# Patient Record
Sex: Female | Born: 1970 | Race: White | Hispanic: No | Marital: Single | Smoking: Current every day smoker
Health system: Southern US, Community
[De-identification: ages and names within clinical notes are randomized; demographics above are authoritative.]

## PROBLEM LIST (undated history)

## (undated) DIAGNOSIS — M5430 Sciatica, unspecified side: Secondary | ICD-10-CM

## (undated) DIAGNOSIS — I10 Essential (primary) hypertension: Secondary | ICD-10-CM

## (undated) DIAGNOSIS — M5136 Other intervertebral disc degeneration, lumbar region: Secondary | ICD-10-CM

## (undated) HISTORY — DX: Sciatica, unspecified side: M54.30

## (undated) HISTORY — DX: Other intervertebral disc degeneration, lumbar region: M51.36

## (undated) HISTORY — PX: TONSILLECTOMY: SUR1361

## (undated) HISTORY — PX: TUBAL LIGATION: SHX77

---

## 2004-09-11 ENCOUNTER — Ambulatory Visit (HOSPITAL_COMMUNITY): Admission: AD | Admit: 2004-09-11 | Discharge: 2004-09-11 | Payer: Self-pay | Admitting: Obstetrics and Gynecology

## 2004-09-11 ENCOUNTER — Encounter: Payer: Self-pay | Admitting: Emergency Medicine

## 2005-05-02 ENCOUNTER — Emergency Department (HOSPITAL_COMMUNITY): Admission: EM | Admit: 2005-05-02 | Discharge: 2005-05-02 | Payer: Self-pay | Admitting: Emergency Medicine

## 2009-05-11 ENCOUNTER — Emergency Department: Payer: Self-pay

## 2013-11-02 ENCOUNTER — Emergency Department: Payer: Self-pay | Admitting: Internal Medicine

## 2013-11-02 LAB — URINALYSIS, COMPLETE
Bilirubin,UR: NEGATIVE
Glucose,UR: NEGATIVE mg/dL (ref 0–75)
Ketone: NEGATIVE
Nitrite: POSITIVE
Ph: 6 (ref 4.5–8.0)
Protein: 100
RBC,UR: 70 /HPF (ref 0–5)
Specific Gravity: 1.011 (ref 1.003–1.030)
Squamous Epithelial: NONE SEEN
WBC UR: 5186 /HPF (ref 0–5)

## 2016-07-31 ENCOUNTER — Encounter: Payer: Self-pay | Admitting: Emergency Medicine

## 2016-07-31 ENCOUNTER — Emergency Department
Admission: EM | Admit: 2016-07-31 | Discharge: 2016-07-31 | Disposition: A | Discharge status: Home / Self Care | Payer: Self-pay | Attending: Emergency Medicine | Admitting: Emergency Medicine

## 2016-07-31 DIAGNOSIS — Z760 Encounter for issue of repeat prescription: Secondary | ICD-10-CM | POA: Insufficient documentation

## 2016-07-31 DIAGNOSIS — F17210 Nicotine dependence, cigarettes, uncomplicated: Secondary | ICD-10-CM | POA: Insufficient documentation

## 2016-07-31 DIAGNOSIS — I10 Essential (primary) hypertension: Secondary | ICD-10-CM | POA: Insufficient documentation

## 2016-07-31 HISTORY — DX: Essential (primary) hypertension: I10

## 2016-07-31 LAB — CBC
HCT: 45.6 % (ref 35.0–47.0)
Hemoglobin: 15.5 g/dL (ref 12.0–16.0)
MCH: 31.2 pg (ref 26.0–34.0)
MCHC: 34.1 g/dL (ref 32.0–36.0)
MCV: 91.5 fL (ref 80.0–100.0)
PLATELETS: 170 10*3/uL (ref 150–440)
RBC: 4.98 MIL/uL (ref 3.80–5.20)
RDW: 13.7 % (ref 11.5–14.5)
WBC: 7.5 10*3/uL (ref 3.6–11.0)

## 2016-07-31 LAB — BASIC METABOLIC PANEL
Anion gap: 4 — ABNORMAL LOW (ref 5–15)
BUN: 16 mg/dL (ref 6–20)
CALCIUM: 8.6 mg/dL — AB (ref 8.9–10.3)
CHLORIDE: 107 mmol/L (ref 101–111)
CO2: 28 mmol/L (ref 22–32)
CREATININE: 0.76 mg/dL (ref 0.44–1.00)
Glucose, Bld: 130 mg/dL — ABNORMAL HIGH (ref 65–99)
Potassium: 4 mmol/L (ref 3.5–5.1)
SODIUM: 139 mmol/L (ref 135–145)

## 2016-07-31 MED ORDER — HYDROCHLOROTHIAZIDE 25 MG PO TABS: 25.0000 mg | ORAL_TABLET | Freq: Every day | ORAL | 1 refills | Status: DC

## 2016-07-31 MED ORDER — LISINOPRIL 20 MG PO TABS: 20.0000 mg | ORAL_TABLET | Freq: Every day | ORAL | 1 refills | Status: DC

## 2016-07-31 MED ORDER — HYDROCHLOROTHIAZIDE 25 MG PO TABS
25.0000 mg | ORAL_TABLET | Freq: Every day | ORAL | Status: DC
  Administered 2016-07-31: 25 mg via ORAL
  Filled 2016-07-31 (×2): qty 1

## 2016-07-31 MED ORDER — LISINOPRIL 10 MG PO TABS
20.0000 mg | ORAL_TABLET | Freq: Once | ORAL | Status: AC
  Administered 2016-07-31: 20 mg via ORAL
  Filled 2016-07-31: qty 2

## 2016-07-31 NOTE — ED Notes (Signed)
Pt in via triage with complaints of high blood pressure.  Pt reports being on Lisinopril and Hctz, [facility] since recently getting out of prison to have prescriptions refilled.  Pt reports being off lisinopril x approximately 1 week.  Pt BP 163/127 currently.  Pt denies any headache, does report tension to back of neck.  Pt A/Ox4, no immediate distress noted.

## 2016-07-31 NOTE — ED Provider Notes (Signed)
[facility] Emergency Department Provider Note        Time seen: ----------------------------------------- 2:26 PM on 07/31/2016 -----------------------------------------    I have reviewed the triage vital signs and the nursing notes.   HISTORY  Chief Complaint Hypertension and Migraine    HPI Latoya Brown is a 45 y.o. female who presents to ER with dizziness and headache for the last several days. Patient states her blood pressure was taken a few days ago and it was high. Today she feels as if she has a tension type headache and upper back muscle tightness. She doesn't history of hypertension but has been intermittently taking medications due to financial reasons since July. She is to be on lisinopril 40 mg daily and HCTZ 25 mg daily. She does not currently have a doctor.   Past Medical History:  Diagnosis Date  . Hypertension     There are no active problems to display for this patient.   History reviewed. No pertinent surgical history.  Allergies Aspirin and Penicillins  Social History Social History  Substance Use Topics  . Smoking status: Current Every Day Smoker    Packs/day: 1.00    Types: Cigarettes  . Smokeless tobacco: Never Used  . Alcohol use No   Review of Systems Constitutional: Negative for fever. Cardiovascular: Negative for chest pain. Respiratory: Negative for shortness of breath. Gastrointestinal: Negative for abdominal pain, vomiting and diarrhea. Genitourinary: Negative for dysuria. Musculoskeletal: Positive for back pain Skin: Negative for rash. Neurological: Negative for headaches, focal weakness or numbness.  10-point ROS otherwise negative.  ____________________________________________   PHYSICAL EXAM:  VITAL SIGNS: ED Triage Vitals  Enc Vitals Group     BP 07/31/16 1205 (!) 147/104     Pulse Rate 07/31/16 1205 92     Resp 07/31/16 1205 18     Temp 07/31/16 1205 98.3 F (36.8 C)     Temp Source  07/31/16 1205 Oral     SpO2 07/31/16 1205 94 %     Weight 07/31/16 1206 250 lb (113.4 kg)     Height 07/31/16 1206 5\' 5"  (1.651 m)     Head Circumference --      Peak Flow --      Pain Score 07/31/16 1206 5     Pain Loc --      Pain Edu? --      Excl. in GC? --    Constitutional: Alert and oriented. Well appearing and in no distress. Eyes: Conjunctivae are normal. PERRL. Normal extraocular movements. ENT   Head: Normocephalic and atraumatic.   Nose: No congestion/rhinnorhea.   Mouth/Throat: Mucous membranes are moist.   Neck: No stridor. Cardiovascular: Normal rate, regular rhythm. No murmurs, rubs, or gallops. Respiratory: Normal respiratory effort without tachypnea nor retractions. Breath sounds are clear and equal bilaterally. No wheezes/rales/rhonchi. Gastrointestinal: Soft and nontender. Normal bowel sounds Musculoskeletal: Nontender with normal range of motion in all extremities. No lower extremity tenderness nor edema. Neurologic:  Normal speech and language. No gross focal neurologic deficits are appreciated.  Skin:  Skin is warm, dry and intact. No rash noted. Psychiatric: Mood and affect are normal. Speech and behavior are normal.  ____________________________________________  EKG: Interpreted by me. Normal sinus rhythm with a rate of 93 bpm, normal. Left ventricular hypertrophy, left axis deviation  ____________________________________________  ED COURSE:  Pertinent labs & imaging results that were available during my care of the patient were reviewed by me and considered in my medical decision making (see chart  for details). Clinical Course  Patients in no acute distress, her previous symptoms of headache today and dizziness have resolved. She is requesting refill of her antihypertensive medicines which we will gladly provide for her.  Procedures ____________________________________________   LABS (pertinent positives/negatives)  Labs Reviewed  BASIC  METABOLIC PANEL - Abnormal; Notable for the following:       Result Value   Glucose, Bld 130 (*)    Calcium 8.6 (*)    Anion gap 4 (*)    All other components within normal limits  CBC  URINALYSIS COMPLETEWITH MICROSCOPIC ([facility] ONLY)  CBG MONITORING, ED  ____________________________________________  FINAL ASSESSMENT AND PLAN  Hypertension, medication refill  Plan: Patient with labs as dictated above. Patient's labs and testing or reassuring here. She does likely have long-standing hypertension but otherwise is asymptomatic at this point. I have restarted lisinopril and HCTZ, [facility].   Emily Filbert, MD   Note: This dictation was prepared with Dragon dictation. Any transcriptional errors that result from this process are unintentional    Emily Filbert, MD 07/31/16 [id number]

## 2016-07-31 NOTE — ED Triage Notes (Addendum)
C/O dizziness and headache for the past few days.  States she had her blood pressure taken a few days ago and it was high.  Today feels as if she has a tension headache and upper back muscle tightness.  Patient states she has history of HTN, but has not taken any medications since July.  Had been given lisinpril 40 mg QD, and HCTZ 5 mg daily.  Patient does not have a PCP.

## 2016-10-27 ENCOUNTER — Encounter: Payer: Self-pay | Admitting: Emergency Medicine

## 2016-10-27 ENCOUNTER — Emergency Department
Admission: EM | Admit: 2016-10-27 | Discharge: 2016-10-27 | Disposition: A | Discharge status: Home / Self Care | Payer: Self-pay | Attending: Emergency Medicine | Admitting: Emergency Medicine

## 2016-10-27 ENCOUNTER — Emergency Department: Payer: Self-pay

## 2016-10-27 DIAGNOSIS — F17210 Nicotine dependence, cigarettes, uncomplicated: Secondary | ICD-10-CM | POA: Insufficient documentation

## 2016-10-27 DIAGNOSIS — J209 Acute bronchitis, unspecified: Secondary | ICD-10-CM | POA: Insufficient documentation

## 2016-10-27 DIAGNOSIS — Z79899 Other long term (current) drug therapy: Secondary | ICD-10-CM | POA: Insufficient documentation

## 2016-10-27 DIAGNOSIS — I10 Essential (primary) hypertension: Secondary | ICD-10-CM | POA: Insufficient documentation

## 2016-10-27 MED ORDER — CLONIDINE HCL 0.1 MG PO TABS
0.2000 mg | ORAL_TABLET | Freq: Once | ORAL | Status: AC
  Administered 2016-10-27: 0.2 mg via ORAL
  Filled 2016-10-27: qty 2

## 2016-10-27 MED ORDER — BENZONATATE 100 MG PO CAPS
200.0000 mg | ORAL_CAPSULE | Freq: Once | ORAL | Status: AC
  Administered 2016-10-27: 200 mg via ORAL
  Filled 2016-10-27: qty 2

## 2016-10-27 MED ORDER — METHYLPREDNISOLONE 4 MG PO TBPK: ORAL_TABLET | ORAL | 0 refills | Status: DC

## 2016-10-27 MED ORDER — HYDROCOD POLST-CPM POLST ER 10-8 MG/5ML PO SUER: 5.0000 mL | Freq: Two times a day (BID) | ORAL | 0 refills | Status: DC

## 2016-10-27 MED ORDER — METHYLPREDNISOLONE SODIUM SUCC 125 MG IJ SOLR
125.0000 mg | Freq: Once | INTRAMUSCULAR | Status: AC
  Administered 2016-10-27: 125 mg via INTRAMUSCULAR
  Filled 2016-10-27: qty 2

## 2016-10-27 MED ORDER — IPRATROPIUM-ALBUTEROL 0.5-2.5 (3) MG/3ML IN SOLN
3.0000 mL | Freq: Once | RESPIRATORY_TRACT | Status: AC
  Administered 2016-10-27: 3 mL via RESPIRATORY_TRACT
  Filled 2016-10-27: qty 3

## 2016-10-27 NOTE — ED Provider Notes (Signed)
[facility] Emergency Department Provider Note   ____________________________________________   First MD Initiated Contact with Patient 10/27/16 1438     (approximate)  I have reviewed the triage vital signs and the nursing notes.   HISTORY  Chief Complaint Cough    HPI Latoya Brown is a 45 y.o. female patient complaining of chest congestion with nonproductive cough for 4 days. Patient has a positive tobacco use half pack per day over 25 years. Patient denies nasal congestion runny nose this complaint. Patient denies sore throat. Patient stated there is no nausea vomiting or diarrhea. Patient states her blood pressure was high because she believes she's not on a creek dose of her medications. Patient states she was taking lisinopril 40 mg but she went off a couple months and restarted her back and 20 mg. Patient states [facility] but did not have an appointment until next week. Patient is still taking hydrochlorothiazide at 25 mg.Patient denies headache, vision disturbance or vertigo with her elevated blood pressure.   Past Medical History:  Diagnosis Date  . Hypertension     There are no active problems to display for this patient.   No past surgical history on file.  Prior to Admission medications   Medication Sig Start Date End Date Taking? Authorizing Provider  chlorpheniramine-HYDROcodone (TUSSIONEX PENNKINETIC ER) 10-8 MG/5ML SUER Take 5 mLs by mouth 2 (two) times daily. 10/27/16   Joni Reiningonald K Amara Justen, PA-C  hydrochlorothiazide (HYDRODIURIL) 25 MG tablet Take 1 tablet (25 mg total) by mouth daily. 07/31/16   Emily FilbertJonathan E Williams, MD  lisinopril (PRINIVIL,ZESTRIL) 20 MG tablet Take 1 tablet (20 mg total) by mouth daily. 07/31/16   Emily FilbertJonathan E Williams, MD  methylPREDNISolone (MEDROL DOSEPAK) 4 MG TBPK tablet Take Tapered dose as directed 10/27/16   Joni Reiningonald K Proctor Carriker, PA-C    Allergies Aspirin and Penicillins  No  family history on file.  Social History Social History  Substance Use Topics  . Smoking status: Current Every Day Smoker    Packs/day: 1.00    Types: Cigarettes  . Smokeless tobacco: Never Used  . Alcohol use No    Review of Systems Constitutional: No fever/chills Eyes: No visual changes. ENT: No sore throat. Cardiovascular: Denies chest pain. Respiratory: Denies shortness of breath. Nonproductive cough and wheezing. Gastrointestinal: No abdominal pain.  No nausea, no vomiting.  No diarrhea.  No constipation. Genitourinary: Negative for dysuria. Musculoskeletal: Negative for back pain. Skin: Negative for rash. Neurological: Negative for headaches, focal weakness or numbness.    ____________________________________________   PHYSICAL EXAM:  VITAL SIGNS: ED Triage Vitals  Enc Vitals Group     BP 10/27/16 1429 (!) 192/114     Pulse Rate 10/27/16 1428 99     Resp 10/27/16 1428 (!) 24     Temp 10/27/16 1428 98.2 F (36.8 C)     Temp Source 10/27/16 1428 Oral     SpO2 10/27/16 1429 94 %     Weight 10/27/16 1428 280 lb (127 kg)     Height 10/27/16 1428 5\' 5"  (1.651 m)     Head Circumference --      Peak Flow --      Pain Score 10/27/16 1429 0     Pain Loc --      Pain Edu? --      Excl. in GC? --     Constitutional: Alert and oriented. Well appearing and in no acute distress. Eyes: Conjunctivae  are normal. PERRL. EOMI. Head: Atraumatic. Nose: No congestion/rhinnorhea. Mouth/Throat: Mucous membranes are moist.  Oropharynx non-erythematous. Neck: No stridor. No cervical spine tenderness to palpation. Hematological/Lymphatic/Immunilogical: No cervical lymphadenopathy. Cardiovascular: Normal rate, regular rhythm. Grossly normal heart sounds.  Good peripheral circulation. Blood pressure is elevated. Respiratory: Normal respiratory effort.  No retractions. LungsBilateral inspiratory history wheezing  Gastrointestinal: Soft and nontender. No distention. No abdominal  bruits. No CVA tenderness. Musculoskeletal: No lower extremity tenderness nor edema.  No joint effusions. Neurologic:  Normal speech and language. No gross focal neurologic deficits are appreciated. No gait instability. Skin:  Skin is warm, dry and intact. No rash noted. Psychiatric: Mood and affect are normal. Speech and behavior are normal.  ____________________________________________   LABS (all labs ordered are listed, but only abnormal results are displayed)  Labs Reviewed - No data to display ____________________________________________  EKG   ____________________________________________  RADIOLOGY  No acute findings on chest x-ray. ____________________________________________   PROCEDURES  Procedure(s) performed: None  Procedures  Critical Care performed: No  ____________________________________________   INITIAL IMPRESSION / ASSESSMENT AND PLAN / ED COURSE  Pertinent labs & imaging results that were available during my care of the patient were reviewed by me and considered in my medical decision making (see chart for details).  Bronchitis. Patient given discharge care instructions. Patient get a prescription for Medrol Dosepak and Tussionex. Patient [facility] to elevated blood pressure.  Clinical Course      ____________________________________________   FINAL CLINICAL IMPRESSION(S) / ED DIAGNOSES  Final diagnoses:  Acute bronchitis, unspecified organism      NEW MEDICATIONS STARTED DURING THIS VISIT:  New Prescriptions   CHLORPHENIRAMINE-HYDROCODONE (TUSSIONEX PENNKINETIC ER) 10-8 MG/5ML SUER    Take 5 mLs by mouth 2 (two) times daily.   METHYLPREDNISOLONE (MEDROL DOSEPAK) 4 MG TBPK TABLET    Take Tapered dose as directed     Note:  This document was prepared using Dragon voice recognition software and may include unintentional dictation errors.    Joni Reiningonald K Dondrell Loudermilk, PA-C 10/27/16 1516      Jennye MoccasinBrian S Quigley, MD 10/28/16 [phone]

## 2016-10-27 NOTE — Discharge Instructions (Signed)
Initial blood pressure reading of 192/114. Her discharge blood pressures 157/111. Advised to continue taking blood pressure medication [facility].

## 2016-10-27 NOTE — ED Triage Notes (Signed)
Pt reports chest congestion with nonproductive cough x4 days. Pt is smoker.

## 2018-05-30 ENCOUNTER — Emergency Department: Payer: Self-pay

## 2018-05-30 ENCOUNTER — Other Ambulatory Visit: Payer: Self-pay

## 2018-05-30 ENCOUNTER — Emergency Department
Admission: EM | Admit: 2018-05-30 | Discharge: 2018-05-30 | Disposition: A | Discharge status: Home / Self Care | Payer: Self-pay | Attending: Student in an Organized Health Care Education/Training Program | Admitting: Student in an Organized Health Care Education/Training Program

## 2018-05-30 DIAGNOSIS — Z79899 Other long term (current) drug therapy: Secondary | ICD-10-CM | POA: Insufficient documentation

## 2018-05-30 DIAGNOSIS — I10 Essential (primary) hypertension: Secondary | ICD-10-CM | POA: Insufficient documentation

## 2018-05-30 DIAGNOSIS — F17210 Nicotine dependence, cigarettes, uncomplicated: Secondary | ICD-10-CM | POA: Insufficient documentation

## 2018-05-30 DIAGNOSIS — J40 Bronchitis, not specified as acute or chronic: Secondary | ICD-10-CM | POA: Insufficient documentation

## 2018-05-30 MED ORDER — PREDNISONE 10 MG PO TABS: 10.0000 mg | ORAL_TABLET | Freq: Two times a day (BID) | ORAL | 0 refills | Status: DC

## 2018-05-30 MED ORDER — IPRATROPIUM-ALBUTEROL 0.5-2.5 (3) MG/3ML IN SOLN
3.0000 mL | Freq: Once | RESPIRATORY_TRACT | Status: AC
  Administered 2018-05-30: 3 mL via RESPIRATORY_TRACT
  Filled 2018-05-30: qty 3

## 2018-05-30 MED ORDER — BENZONATATE 100 MG PO CAPS: ORAL_CAPSULE | ORAL | 0 refills | Status: DC

## 2018-05-30 MED ORDER — LISINOPRIL 40 MG PO TABS: 40.0000 mg | ORAL_TABLET | Freq: Every day | ORAL | 2 refills | Status: DC

## 2018-05-30 MED ORDER — GUAIFENESIN-CODEINE 100-10 MG/5ML PO SOLN: 5.0000 mL | Freq: Three times a day (TID) | ORAL | 0 refills | Status: DC | PRN

## 2018-05-30 NOTE — Discharge Instructions (Addendum)
Your exam is consistent with acute bronchitis. There is no evidence of pneumonia. Take the prescriptions as directed. Follow-up with your provider for ongoing symptoms.

## 2018-05-30 NOTE — ED Triage Notes (Signed)
Pt c/o non productive cough for the past week. Pt is in NAD on arrival.

## 2018-05-30 NOTE — ED Provider Notes (Signed)
[facility] Emergency Department Provider Note ____________________________________________  Time seen: 1337  I have reviewed the triage vital signs and the nursing notes.  HISTORY  Chief Complaint  URI  HPI Latoya Brown is a 47 y.o. female presents to the ED for evaluation of a one-week complaint of intermittent nonproductive cough.  Patient denies any interim fevers, chills, sweats.  She is a current everyday smoker and has a history of hypertension.  She denies any recent travel, sick contacts, or other exposures.  She was apparently treated incompletely for bronchitis back in May, she admits to not taking the antibiotics that were prescribed completely.  She denies any chest pain, shortness of breath, or hemoptysis.  Past Medical History:  Diagnosis Date  . Hypertension     There are no active problems to display for this patient.   Past Surgical History:  Procedure Laterality Date  . TONSILLECTOMY      Prior to Admission medications   Medication Sig Start Date End Date Taking? Authorizing Provider  benzonatate (TESSALON PERLES) 100 MG capsule Take 1-2 tabs TID prn cough 05/30/18   Parish Augustine, Charlesetta Ivory, PA-C  chlorpheniramine-HYDROcodone (TUSSIONEX PENNKINETIC ER) 10-8 MG/5ML SUER Take 5 mLs by mouth 2 (two) times daily. 10/27/16   Joni Reining, PA-C  guaiFENesin-codeine 100-10 MG/5ML syrup Take 5 mLs by mouth 3 (three) times daily as needed for cough. 05/30/18   Janeah Kovacich, Charlesetta Ivory, PA-C  hydrochlorothiazide (HYDRODIURIL) 25 MG tablet Take 1 tablet (25 mg total) by mouth daily. 07/31/16   Emily Filbert, MD  lisinopril (PRINIVIL,ZESTRIL) 40 MG tablet Take 1 tablet (40 mg total) by mouth daily. 05/30/18 08/28/18  Patrcia Schnepp, Charlesetta Ivory, PA-C  methylPREDNISolone (MEDROL DOSEPAK) 4 MG TBPK tablet Take Tapered dose as directed 10/27/16   Joni Reining, PA-C  predniSONE (DELTASONE) 10 MG tablet Take 1 tablet (10 mg total) by mouth 2 (two)  times daily with a meal. 05/30/18   Alisa Stjames, Charlesetta Ivory, PA-C    Allergies Aspirin and Penicillins  No family history on file.  Social History Social History   Tobacco Use  . Smoking status: Current Every Day Smoker    Packs/day: 1.00    Types: Cigarettes  . Smokeless tobacco: Never Used  Substance Use Topics  . Alcohol use: No  . Drug use: No    Review of Systems  Constitutional: Negative for fever. Eyes: Negative for visual changes. ENT: Negative for sore throat. Cardiovascular: Negative for chest pain. Respiratory: Negative for shortness of breath.  Reports nonproductive cough as above. Gastrointestinal: Negative for abdominal pain and diarrhea. Genitourinary: Negative for dysuria. Musculoskeletal: Negative for back pain. Skin: Negative for rash. Neurological: Negative for headaches, focal weakness or numbness. ____________________________________________  PHYSICAL EXAM:  VITAL SIGNS: ED Triage Vitals  Enc Vitals Group     BP 05/30/18 1243 (!) 163/110     Pulse Rate 05/30/18 1243 71     Resp 05/30/18 1243 20     Temp 05/30/18 1243 98.2 F (36.8 C)     Temp Source 05/30/18 1243 Oral     SpO2 05/30/18 1243 94 %     Weight 05/30/18 1243 260 lb (117.9 kg)     Height 05/30/18 1243 5\' 5"  (1.651 m)     Head Circumference --      Peak Flow --      Pain Score 05/30/18 1249 6     Pain Loc --      Pain Edu? --  Excl. in GC? --     Constitutional: Alert and oriented. Well appearing and in no distress. Head: Normocephalic and atraumatic. Eyes: Conjunctivae are normal. PERRL. Normal extraocular movements Ears: Canals clear. TMs intact bilaterally. Nose: No congestion/rhinorrhea/epistaxis. Mouth/Throat: Mucous membranes are moist. Cardiovascular: Normal rate, regular rhythm. Normal distal pulses. Respiratory: Normal respiratory effort. No wheezes/rales/rhonchi. ____________________________________________   RADIOLOGY  CXR  IMPRESSION: No active  cardiopulmonary disease. ____________________________________________  PROCEDURES  Procedures DuoNeb x 1 ____________________________________________  INITIAL IMPRESSION / ASSESSMENT AND PLAN / ED COURSE  Presents herself to the ED for evaluation intermittent cough for the last week.  Patient without any fevers, chills, shortness of breath, hemoptysis reports symptoms that have been persistent with a nonproductive cough.  Her exam is overall benign.  Chest x-ray is reassuring as it shows no acute cardia pulmonary process.  Patient likely represents a viral etiology for acute bronchitis.  She will be discharged with prescriptions for Tessalon Perles, guaifenesin-codeine syrup, and prednisone.  A courtesy prescription for lisinopril is also provided at this time.  [facility] will [facility] and the [place] [facility].  Return precautions have been reviewed.  I reviewed the patient's prescription history over the last 12 months in the multi-state controlled substances database(s) that includes [place], [place], [place], [place], [place], [place], [place], [place], [place], [place], [place], [place], [place], and [place].  Results were notable for no recent prescriptions.  ____________________________________________  FINAL CLINICAL IMPRESSION(S) / ED DIAGNOSES  Final diagnoses:  Bronchitis      Karmen StabsMenshew, Charlesetta IvoryJenise V Bacon, PA-C 05/30/18 1545    Willy Eddyobinson, Patrick, MD 05/30/18 1701

## 2018-05-30 NOTE — ED Notes (Addendum)
See triage note  States she developed a non productive cough   sxs' started about 1 week ago  No fever   Resp even and non labored

## 2018-11-24 ENCOUNTER — Emergency Department
Admission: EM | Admit: 2018-11-24 | Discharge: 2018-11-24 | Disposition: A | Discharge status: Home / Self Care | Payer: Self-pay | Attending: Emergency Medicine | Admitting: Emergency Medicine

## 2018-11-24 ENCOUNTER — Encounter: Payer: Self-pay | Admitting: Emergency Medicine

## 2018-11-24 DIAGNOSIS — I10 Essential (primary) hypertension: Secondary | ICD-10-CM | POA: Insufficient documentation

## 2018-11-24 DIAGNOSIS — F17210 Nicotine dependence, cigarettes, uncomplicated: Secondary | ICD-10-CM | POA: Insufficient documentation

## 2018-11-24 DIAGNOSIS — R21 Rash and other nonspecific skin eruption: Secondary | ICD-10-CM | POA: Insufficient documentation

## 2018-11-24 DIAGNOSIS — Z79899 Other long term (current) drug therapy: Secondary | ICD-10-CM | POA: Insufficient documentation

## 2018-11-24 MED ORDER — SULFAMETHOXAZOLE-TRIMETHOPRIM 800-160 MG PO TABS: 1.0000 | ORAL_TABLET | Freq: Two times a day (BID) | ORAL | 0 refills | Status: DC

## 2018-11-24 MED ORDER — PREDNISONE 10 MG (21) PO TBPK: ORAL_TABLET | ORAL | 0 refills | Status: DC

## 2018-11-24 NOTE — ED Notes (Signed)
Latoya CooterHenry RN called the lab to verify what tube was necessary for the ordered test and to notify them that the [employer] interface is not working and that the blood will be sent with a white chart label.

## 2018-11-24 NOTE — ED Triage Notes (Signed)
Pt reports rash from abd up to her face since Thursday. Pt reports they itch. Denies new soaps, foods, clothes, animals in the house or other sx's.

## 2018-11-24 NOTE — ED Provider Notes (Signed)
[facility] Emergency Department Provider Note  ____________________________________________   First MD Initiated Contact with Patient 11/24/18 1340     (approximate)  I have reviewed the triage vital signs and the nursing notes.   HISTORY  Chief Complaint Rash    HPI Latoya Brown is a 47 y.o. female presents to the emergency department complaining of a rash on her abdomen and face since Thursday.  She states it is very itchy.  She denies any new exposures.  She denies staying in someone else's bed.  She denies any fever or chills.    Past Medical History:  Diagnosis Date  . Hypertension     There are no active problems to display for this patient.   Past Surgical History:  Procedure Laterality Date  . TONSILLECTOMY      Prior to Admission medications   Medication Sig Start Date End Date Taking? Authorizing Provider  hydrochlorothiazide (HYDRODIURIL) 25 MG tablet Take 1 tablet (25 mg total) by mouth daily. 07/31/16   Emily FilbertWilliams, Jonathan E, MD  lisinopril (PRINIVIL,ZESTRIL) 40 MG tablet Take 1 tablet (40 mg total) by mouth daily. 05/30/18 08/28/18  Menshew, Charlesetta IvoryJenise V Bacon, PA-C  predniSONE (STERAPRED UNI-PAK 21 TAB) 10 MG (21) TBPK tablet Take 6 pills on day one then decrease by 1 pill each day 11/24/18   Faythe GheeFisher, Evon Dejarnett W, PA-C  sulfamethoxazole-trimethoprim (BACTRIM DS,SEPTRA DS) 800-160 MG tablet Take 1 tablet by mouth 2 (two) times daily. 11/24/18   Faythe GheeFisher, Donesha Wallander W, PA-C    Allergies Aspirin and Penicillins  No family history on file.  Social History Social History   Tobacco Use  . Smoking status: Current Every Day Smoker    Packs/day: 1.00    Types: Cigarettes  . Smokeless tobacco: Never Used  Substance Use Topics  . Alcohol use: No  . Drug use: No    Review of Systems  Constitutional: No fever/chills Eyes: No visual changes. ENT: No sore throat. Respiratory: Denies cough Genitourinary: Negative for dysuria. Musculoskeletal:  Negative for back pain. Skin: Positive for rash.    ____________________________________________   PHYSICAL EXAM:  VITAL SIGNS: ED Triage Vitals  Enc Vitals Group     BP 11/24/18 1305 (!) 186/113     Pulse Rate 11/24/18 1305 (!) 102     Resp 11/24/18 1305 14     Temp 11/24/18 1305 97.7 F (36.5 C)     Temp Source 11/24/18 1305 Oral     SpO2 11/24/18 1305 96 %     Weight 11/24/18 1302 220 lb (99.8 kg)     Height 11/24/18 1302 5\' 5"  (1.651 m)     Head Circumference --      Peak Flow --      Pain Score 11/24/18 1302 0     Pain Loc --      Pain Edu? --      Excl. in GC? --     Constitutional: Alert and oriented. Well appearing and in no acute distress. Eyes: Conjunctivae are normal.  Head: Atraumatic. Nose: No congestion/rhinnorhea. Mouth/Throat: Mucous membranes are moist.   Neck:  supple no lymphadenopathy noted Cardiovascular: Normal rate, regular rhythm. Heart sounds are normal Respiratory: Normal respiratory effort.  No retractions, lungs c t a  GU: deferred Musculoskeletal: FROM all extremities, warm and well perfused Neurologic:  Normal speech and language.  Skin:  Skin is warm, dry and intact.  Positive multiple scabbed areas noted on the face, scalp, trunk.  No vesicles are noted.  No pustules noted.  No burrows are noted. Psychiatric: Mood and affect are normal. Speech and behavior are normal.  ____________________________________________   LABS (all labs ordered are listed, but only abnormal results are displayed)  Labs Reviewed  RPR   ____________________________________________   ____________________________________________  RADIOLOGY    ____________________________________________   PROCEDURES  Procedure(s) performed: No  Procedures    ____________________________________________   INITIAL IMPRESSION / ASSESSMENT AND PLAN / ED COURSE  Pertinent labs & imaging results that were available during my care of the patient were reviewed  by me and considered in my medical decision making (see chart for details).   Patient is a 47 year old female presents emergency department with a rash.  Physical exam shows multiple scabbed areas which could be anything from Staphylococcus, syphilis, or scabies.  Explained these findings to the patient.  RPR was drawn.  Explained to her the test results would be delayed for about 2 days.  She was started on Bactrim and prednisone.  She is to follow-up with her regular doctor or the [facility] if needed.  Return if worsening.  States she understands will comply.  She is discharged in good condition.     As part of my medical decision making, I reviewed the following data within the electronic MEDICAL RECORD NUMBER Nursing notes reviewed and incorporated, Old chart reviewed, Notes from prior ED visits and [place] Controlled Substance Database  ____________________________________________   FINAL CLINICAL IMPRESSION(S) / ED DIAGNOSES  Final diagnoses:  Rash      NEW MEDICATIONS STARTED DURING THIS VISIT:  Discharge Medication List as of 11/24/2018  2:15 PM    START taking these medications   Details  predniSONE (STERAPRED UNI-PAK 21 TAB) 10 MG (21) TBPK tablet Take 6 pills on day one then decrease by 1 pill each day, Print    sulfamethoxazole-trimethoprim (BACTRIM DS,SEPTRA DS) 800-160 MG tablet Take 1 tablet by mouth 2 (two) times daily., Starting Sun 11/24/2018, Print         Note:  This document was prepared using Dragon voice recognition software and may include unintentional dictation errors.    Faythe GheeFisher, Amaira Safley W, PA-C 11/24/18 1657    Schaevitz, Myra Rudeavid Matthew, MD 11/25/18 [phone]

## 2018-11-26 LAB — RPR: RPR Ser Ql: NONREACTIVE

## 2020-01-18 IMAGING — CR DG CHEST 2V
2 series · 2 of 2 positions shown · non-contrast
Comparison: 10/27/2016

CLINICAL DATA: Nonproductive cough for the past week. Short of
breath with exertion.

EXAM:
CHEST - 2 VIEW

[chest pa]
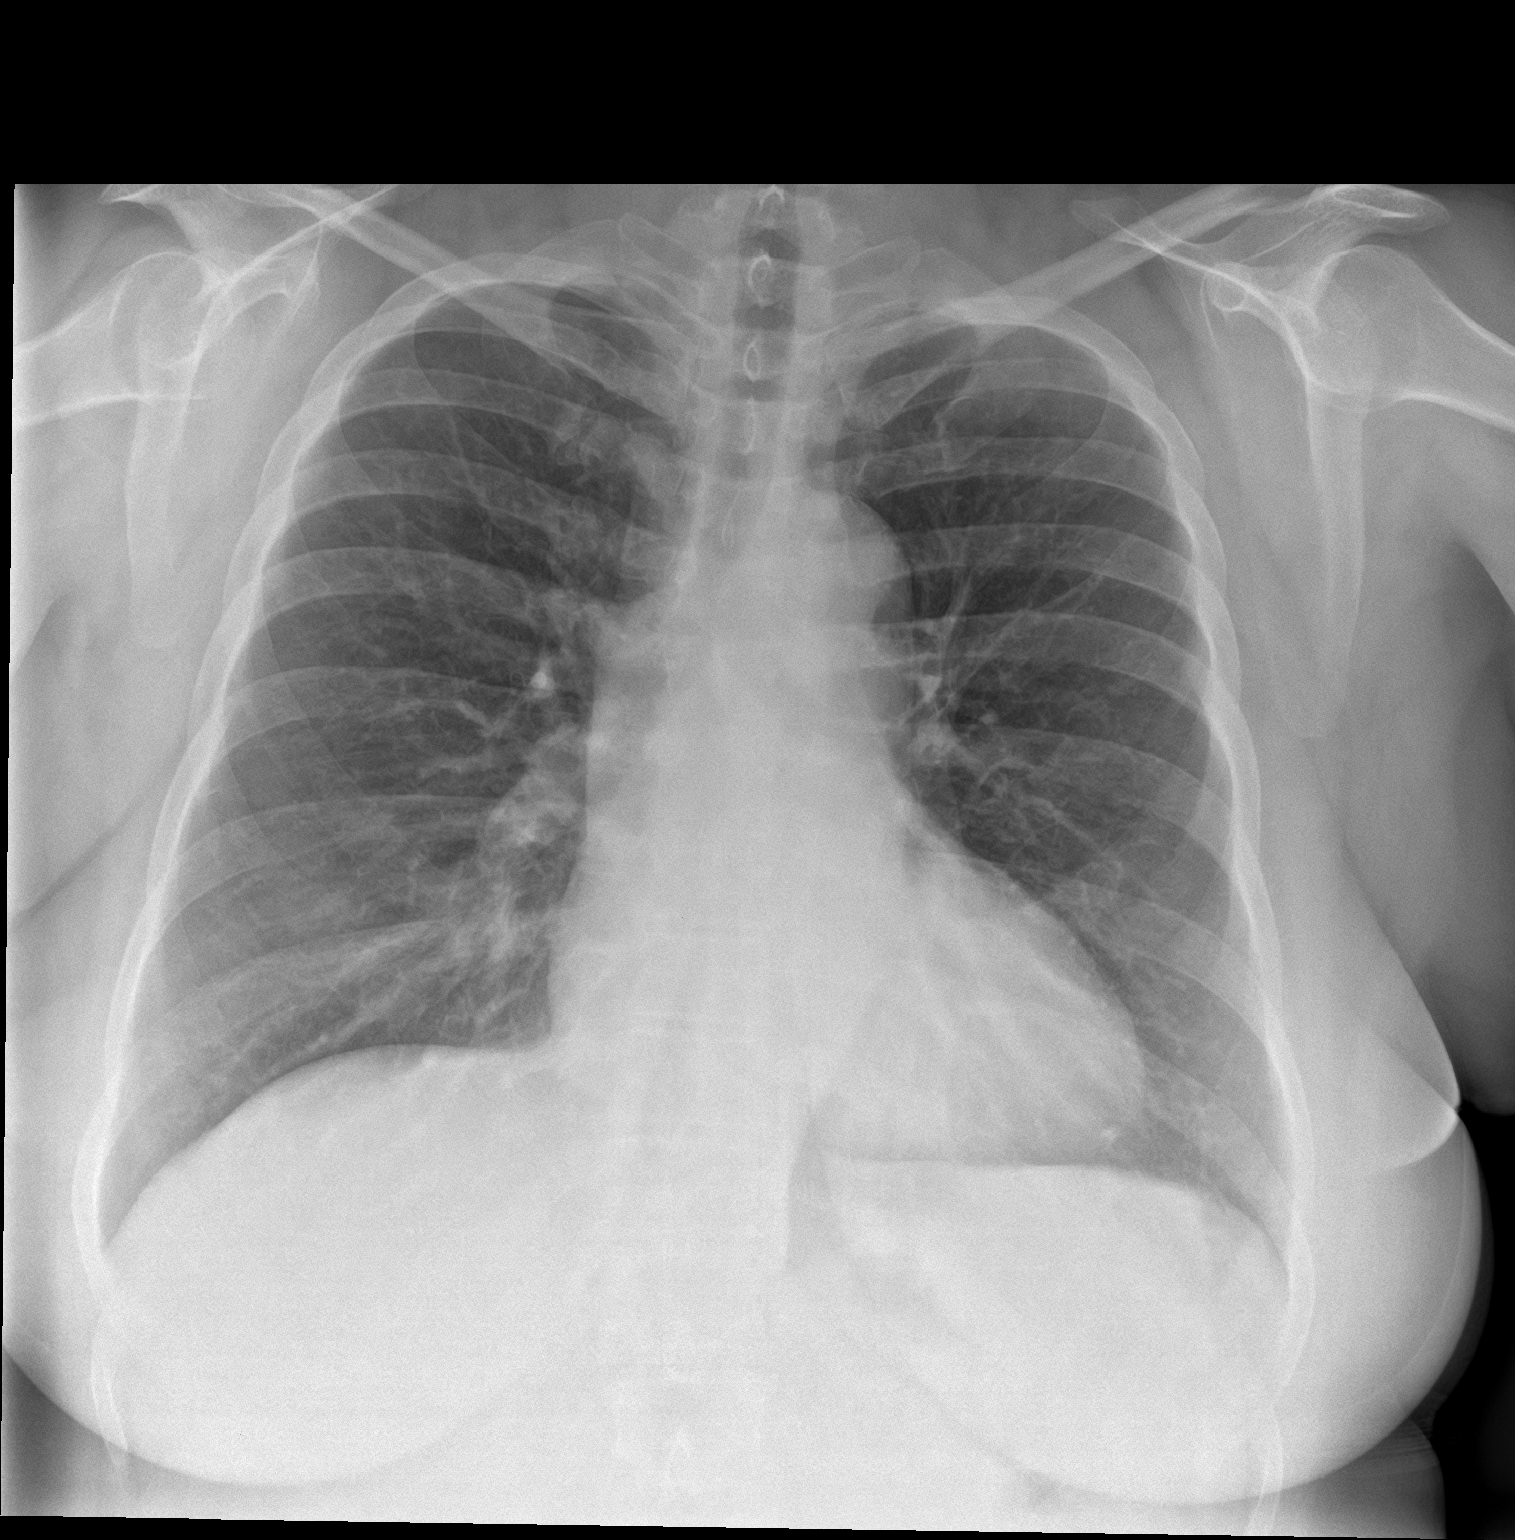

[chest lat]
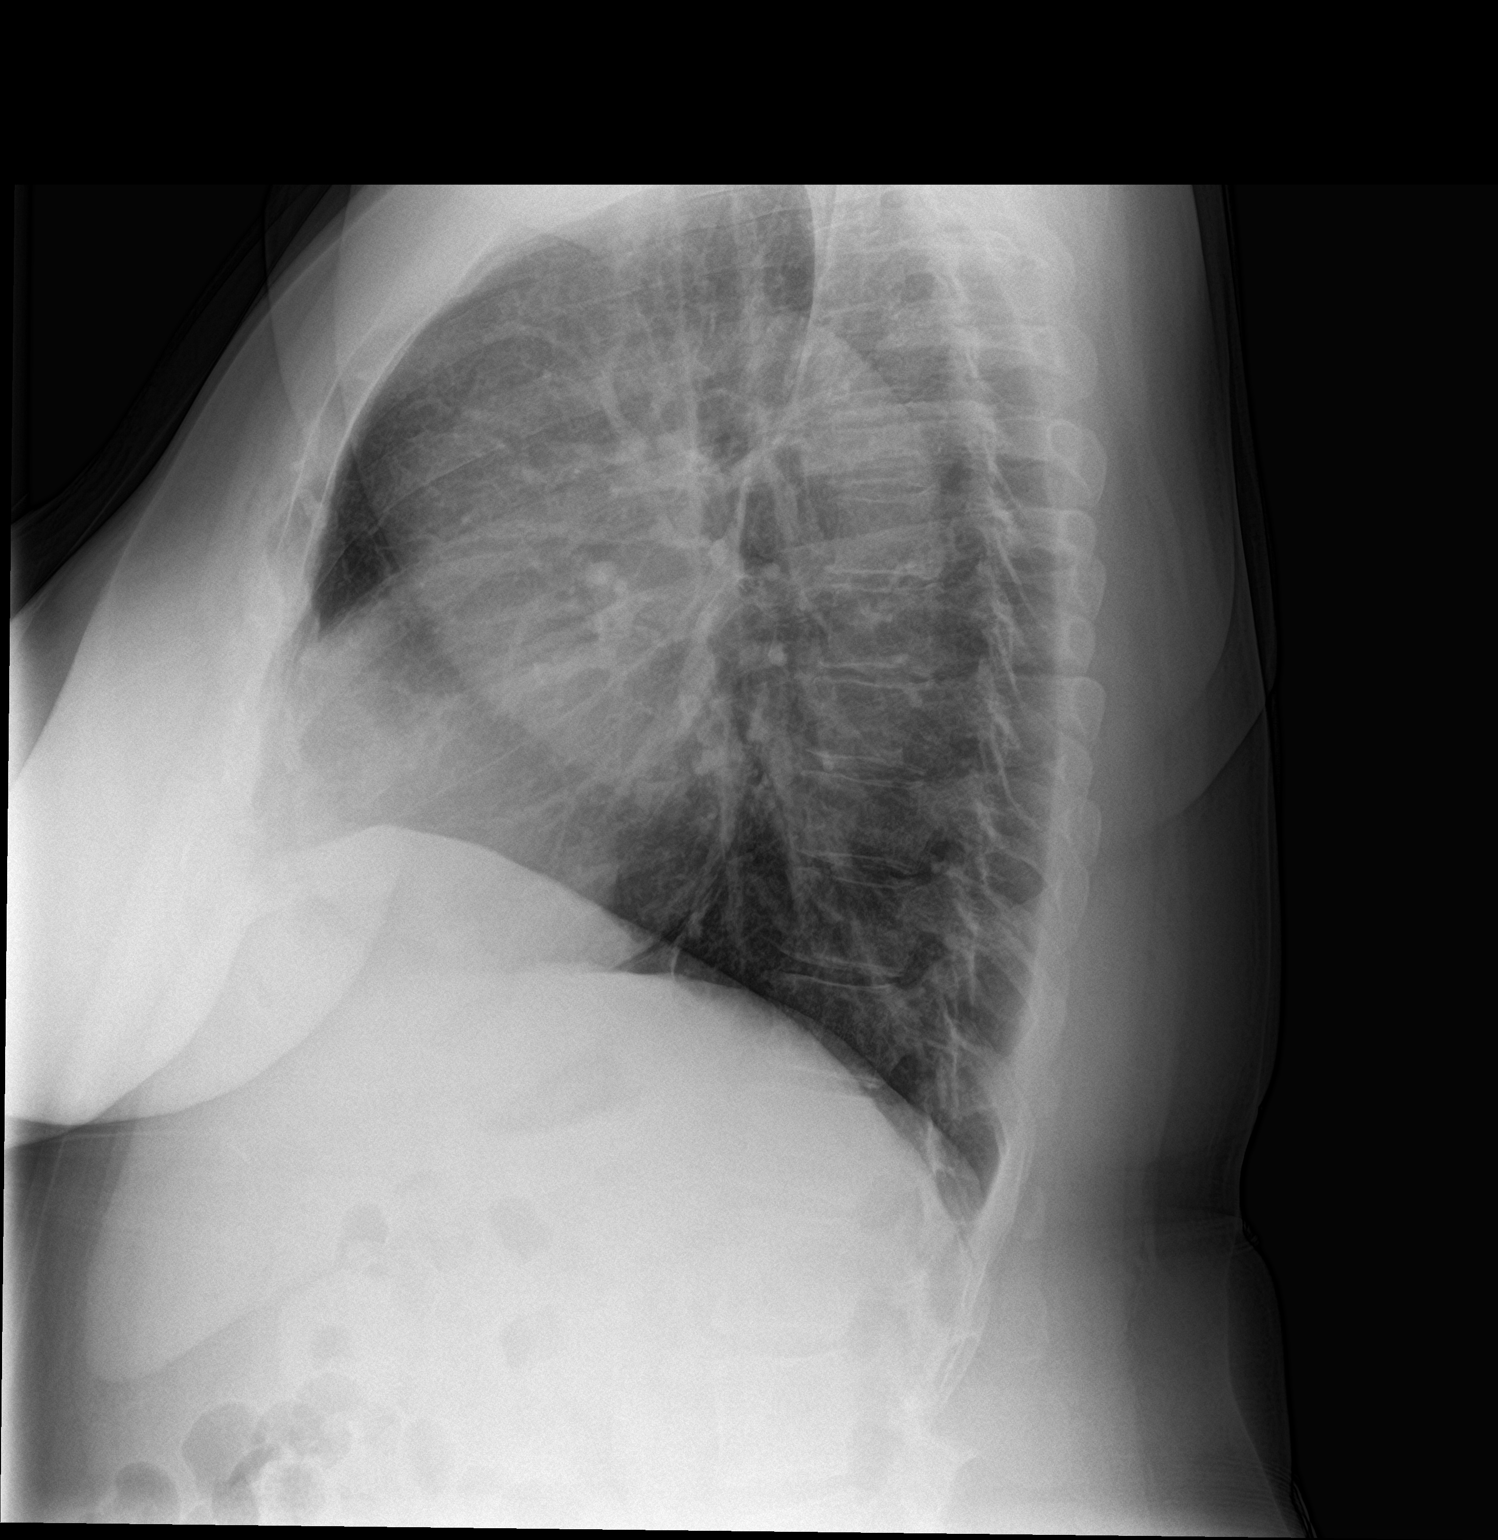

[2 of 2 positions shown; findings below may reference images not displayed]

FINDINGS: Cardiac silhouette is normal in size. No mediastinal or hilar
masses. No evidence of adenopathy.

Clear lungs.  No pleural effusion or pneumothorax.

Skeletal structures are intact.
IMPRESSION: No active cardiopulmonary disease.

## 2022-02-16 ENCOUNTER — Ambulatory Visit (INDEPENDENT_AMBULATORY_CARE_PROVIDER_SITE_OTHER): Payer: 59 | Admitting: Nurse Practitioner

## 2022-02-16 ENCOUNTER — Other Ambulatory Visit: Payer: Self-pay

## 2022-02-16 ENCOUNTER — Encounter: Payer: Self-pay | Admitting: Nurse Practitioner

## 2022-02-16 ENCOUNTER — Encounter (INDEPENDENT_AMBULATORY_CARE_PROVIDER_SITE_OTHER): Payer: Self-pay

## 2022-02-16 ENCOUNTER — Telehealth: Payer: Self-pay

## 2022-02-16 VITALS — BP 168/115 | HR 103 | Temp 98.0°F | Resp 16 | Ht 65.5 in | Wt 269.2 lb

## 2022-02-16 DIAGNOSIS — I10 Essential (primary) hypertension: Secondary | ICD-10-CM

## 2022-02-16 DIAGNOSIS — E782 Mixed hyperlipidemia: Secondary | ICD-10-CM

## 2022-02-16 DIAGNOSIS — G2581 Restless legs syndrome: Secondary | ICD-10-CM

## 2022-02-16 DIAGNOSIS — E538 Deficiency of other specified B group vitamins: Secondary | ICD-10-CM

## 2022-02-16 DIAGNOSIS — E559 Vitamin D deficiency, unspecified: Secondary | ICD-10-CM

## 2022-02-16 DIAGNOSIS — Z23 Encounter for immunization: Secondary | ICD-10-CM

## 2022-02-16 DIAGNOSIS — F1490 Cocaine use, unspecified, uncomplicated: Secondary | ICD-10-CM

## 2022-02-16 DIAGNOSIS — G4719 Other hypersomnia: Secondary | ICD-10-CM

## 2022-02-16 DIAGNOSIS — R7301 Impaired fasting glucose: Secondary | ICD-10-CM

## 2022-02-16 DIAGNOSIS — R9431 Abnormal electrocardiogram [ECG] [EKG]: Secondary | ICD-10-CM | POA: Diagnosis not present

## 2022-02-16 DIAGNOSIS — Z113 Encounter for screening for infections with a predominantly sexual mode of transmission: Secondary | ICD-10-CM

## 2022-02-16 DIAGNOSIS — R21 Rash and other nonspecific skin eruption: Secondary | ICD-10-CM

## 2022-02-16 DIAGNOSIS — D649 Anemia, unspecified: Secondary | ICD-10-CM

## 2022-02-16 DIAGNOSIS — Z7689 Persons encountering health services in other specified circumstances: Secondary | ICD-10-CM

## 2022-02-16 DIAGNOSIS — E039 Hypothyroidism, unspecified: Secondary | ICD-10-CM

## 2022-02-16 MED ORDER — ZOSTER VAC RECOMB ADJUVANTED 50 MCG/0.5ML IM SUSR: 0.5000 mL | Freq: Once | INTRAMUSCULAR | 0 refills | Status: AC

## 2022-02-16 MED ORDER — ROPINIROLE HCL 1 MG PO TABS: 1.0000 mg | ORAL_TABLET | Freq: Every day | ORAL | 1 refills | Status: DC

## 2022-02-16 MED ORDER — METOPROLOL TARTRATE 25 MG PO TABS: 25.0000 mg | ORAL_TABLET | Freq: Two times a day (BID) | ORAL | 2 refills | Status: DC

## 2022-02-16 MED ORDER — AMLODIPINE BESYLATE 5 MG PO TABS: 5.0000 mg | ORAL_TABLET | Freq: Every day | ORAL | 2 refills | Status: DC

## 2022-02-16 MED ORDER — TRIAMCINOLONE ACETONIDE 0.5 % EX OINT: 1.0000 "application " | TOPICAL_OINTMENT | Freq: Two times a day (BID) | CUTANEOUS | 3 refills | Status: DC

## 2022-02-16 NOTE — Progress Notes (Signed)
[facility] ?[address] ?[place], [place] [address] ? ?Internal MEDICINE  ?Office Visit Note ? ?Patient Name: Latoya Brown ? [id number]  ?[id number] ? ?Date of Service: 02/16/2022 ? ? ?Complaints/HPI ?Pt is here for establishment of PCP. ?Chief Complaint  ?Patient presents with  ? New Patient (Initial Visit)  ?  Congestion, RLS, discuss bronchitis, pain in side under arm when taking a deep breath and it releases when she exhales, menopause, has some spots on skin she wants looked at, wants to have skin tag removed (may need to see derm)  ? Hypertension  ? Cough  ? ?HPI ?Latoya Brown presents for a new patient visit to establish care.  She is a 51 year old female with severely elevated blood pressure, restless leg syndrome, symptoms of sleep apnea, and history of drug use.  The patient has not had a set primary care provider before.  She reports that she has a history of being in and out of prison and she got out of prison in 2015.  At this time her mother was very ill and she promised her mother that she would not go to prison again.  Patient has not been in prison since then.  She used to be on blood pressure medications but is not currently on any medications at this time.  She is worried that she will need to restart her medications because her blood pressure is up.  She is not working and she usually stays home. ?She is overdue for an annual physical exam, routine labs and a routine mammogram.  She is also due for colorectal cancer screening.  She also has a scaly patchy rash is predominantly on her breasts and her lower extremities that she states is dry and itchy. ?She does not smoke cigarettes but she does vape.  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  She states dates that she does not use any recreational drugs except that she will do "a line or 2 of Coke every now and then". ?Her heart rate is slightly tachycardic according to her vital signs.  Her blood  pressure is significantly elevated even when rechecked, see vitals. ?She has previous EKGs in 2017 that were abnormal indicating possible left atrial enlargement and left ventricular hypertrophy. ? ? ? ?Current Medication: ?Outpatient Encounter Medications as of 02/16/2022  ?Medication Sig  ? amLODipine (NORVASC) 5 MG tablet Take 1 tablet (5 mg total) by mouth daily.  ? metoprolol tartrate (LOPRESSOR) 25 MG tablet Take 1 tablet (25 mg total) by mouth 2 (two) times daily.  ? rOPINIRole (REQUIP) 1 MG tablet Take 1 tablet (1 mg total) by mouth at bedtime.  ? triamcinolone ointment (KENALOG) 0.5 % Apply 1 application. topically 2 (two) times daily. To affected area until rash resolves.  ? [DISCONTINUED] Zoster Vaccine Adjuvanted [facility]) injection Inject 0.5 mLs into the muscle once.  ? [EXPIRED] Zoster Vaccine Adjuvanted [facility]) injection Inject 0.5 mLs into the muscle once for 1 dose.  ? [DISCONTINUED] hydrochlorothiazide (HYDRODIURIL) 25 MG tablet Take 1 tablet (25 mg total) by mouth daily. (Patient not taking: Reported on 02/16/2022)  ? [DISCONTINUED] lisinopril (PRINIVIL,ZESTRIL) 40 MG tablet Take 1 tablet (40 mg total) by mouth daily.  ? [DISCONTINUED] predniSONE (STERAPRED UNI-PAK 21 TAB) 10 MG (21) TBPK tablet Take 6 pills on day one then decrease by 1 pill each day (Patient not taking: Reported on 02/16/2022)  ? [DISCONTINUED] sulfamethoxazole-trimethoprim (BACTRIM DS,SEPTRA DS) 800-160 MG tablet Take 1  tablet by mouth 2 (two) times daily. (Patient not taking: Reported on 02/16/2022)  ? ?No facility-administered encounter medications on file as of 02/16/2022.  ? ? ?Surgical History: ?Past Surgical History:  ?Procedure Laterality Date  ? TONSILLECTOMY    ? TUBAL LIGATION    ? ? ?Medical History: ?Past Medical History:  ?Diagnosis Date  ? DDD (degenerative disc disease), lumbar   ? Hypertension   ? Sciatica   ? ? ?Family History: ?Family History  ?Problem Relation Age of Onset  ? Depression Mother   ? COPD Mother    ? Bipolar disorder Mother   ? Liver disease Mother   ? Heart disease Father   ? Vascular Disease Father   ? Bipolar disorder Sister   ? Depression Sister   ? Schizophrenia Sister   ? Lung cancer Maternal Grandmother   ? ? ?Social History  ? ?Socioeconomic History  ? Marital status: Single  ?  Spouse name: Not on file  ? Number of children: Not on file  ? Years of education: Not on file  ? Highest education level: Not on file  ?Occupational History  ? Not on file  ?Tobacco Use  ? Smoking status: Every Day  ?  Packs/day: 1.00  ?  Types: Cigarettes, E-cigarettes  ? Smokeless tobacco: Never  ?Substance and Sexual Activity  ? Alcohol use: No  ? Drug use: Yes  ?  Types: Marijuana, Cocaine  ? Sexual activity: Not on file  ?Other Topics Concern  ? Not on file  ?Social History Narrative  ? Not on file  ? ?Social Determinants of Health  ? ?Financial Resource Strain: Not on file  ?Food Insecurity: Not on file  ?Transportation Needs: Not on file  ?Physical Activity: Not on file  ?Stress: Not on file  ?Social Connections: Not on file  ?Intimate Partner Violence: Not on file  ? ? ? ?Review of Systems  ?Constitutional:  Positive for fatigue. Negative for chills and unexpected weight change.  ?HENT:  Positive for congestion. Negative for rhinorrhea, sneezing and sore throat.   ?Eyes:  Negative for redness.  ?Respiratory:  Positive for cough, chest tightness and shortness of breath.   ?Cardiovascular: Negative.  Negative for chest pain and palpitations.  ?Gastrointestinal:  Negative for abdominal pain, constipation, diarrhea, nausea and vomiting.  ?Genitourinary:  Negative for dysuria and frequency.  ?Musculoskeletal:  Negative for arthralgias, back pain, joint swelling and neck pain.  ?Skin:  Negative for rash.  ?Neurological: Negative.  Negative for tremors and numbness.  ?Hematological:  Negative for adenopathy. Does not bruise/bleed easily.  ?Psychiatric/Behavioral:  Negative for behavioral problems (Depression), self-injury,  sleep disturbance and suicidal ideas. The patient is not nervous/anxious.   ? ?Vital Signs: ?BP (!) 168/115 Comment: 180/110  Pulse (!) 103   Temp 98 ?F (36.7 ?C)   Resp 16   Ht 5' 5.5" (1.664 m)   Wt 269 lb 3.2 oz (122.1 kg)   SpO2 98%   BMI 44.12 kg/m?  ? ? ?Physical Exam ?Vitals reviewed.  ?Constitutional:   ?   General: She is not in acute distress. ?   Appearance: Normal appearance. She is obese. She is not ill-appearing.  ?HENT:  ?   Head: Normocephalic and atraumatic.  ?Cardiovascular:  ?   Rate and Rhythm: Normal rate and regular rhythm.  ?   Heart sounds: Normal heart sounds. No murmur heard. ?Pulmonary:  ?   Effort: Pulmonary effort is normal. No respiratory distress.  ?   Breath sounds:  Normal breath sounds. No wheezing.  ?Neurological:  ?   Mental Status: She is alert and oriented to person, place, and time.  ? ? ? ? ?Assessment/Plan: ?1. Essential hypertension ?Blood pressure is severely elevated, patient started on amlodipine 5 mg daily and metoprolol tartrate 25 mg twice daily.  We will reassess blood pressure in 2 weeks. ?- amLODipine (NORVASC) 5 MG tablet; Take 1 tablet (5 mg total) by mouth daily.  Dispense: 30 tablet; Refill: 2 ?- metoprolol tartrate (LOPRESSOR) 25 MG tablet; Take 1 tablet (25 mg total) by mouth 2 (two) times daily.  Dispense: 60 tablet; Refill: 2 ? ?2. Excessive daytime sleepiness ?Sleep study ordered due to excessive daytime sleepiness ?- [facility] SLEEP STUDY; Future ? ?3. Scaly patch rash ?Triamcinolone ointment prescribed to apply to scaly patchy rash areas twice a day until the rash resolves.  Labs also ordered to assess possible causes and dermatology referral placed to further evaluate the rash as well. ?- Ambulatory referral to Dermatology ?- triamcinolone ointment (KENALOG) 0.5 %; Apply 1 application. topically 2 (two) times daily. To affected area until rash resolves.  Dispense: 45 g; Refill: 3 ? ?4. EKG abnormalities ?Patient had a previous EKG in 2017 that was  abnormal and showed possible left atrial enlargement and left ventricular hypertrophy.  We will do a repeat EKG at her next office visit in 2 weeks. ? ?5. Impaired fasting glucose ?Patient has history of impaired

## 2022-02-16 NOTE — Telephone Encounter (Signed)
SS ordered. Printed. Gave to Titania-Toni ?

## 2022-03-04 ENCOUNTER — Encounter: Payer: Self-pay | Admitting: Nurse Practitioner

## 2022-03-16 ENCOUNTER — Ambulatory Visit: Payer: 59 | Admitting: Nurse Practitioner

## 2022-06-29 ENCOUNTER — Other Ambulatory Visit: Payer: Self-pay

## 2022-06-29 ENCOUNTER — Emergency Department: Payer: Self-pay

## 2022-06-29 ENCOUNTER — Inpatient Hospital Stay
Admission: EM | Admit: 2022-06-29 | Discharge: 2022-07-01 | DRG: 190 | Discharge status: Against Medical Advice | Payer: Self-pay | Attending: Internal Medicine | Admitting: Internal Medicine

## 2022-06-29 DIAGNOSIS — J441 Chronic obstructive pulmonary disease with (acute) exacerbation: Principal | ICD-10-CM | POA: Diagnosis present

## 2022-06-29 DIAGNOSIS — Z79899 Other long term (current) drug therapy: Secondary | ICD-10-CM

## 2022-06-29 DIAGNOSIS — Z886 Allergy status to analgesic agent status: Secondary | ICD-10-CM

## 2022-06-29 DIAGNOSIS — Z8249 Family history of ischemic heart disease and other diseases of the circulatory system: Secondary | ICD-10-CM

## 2022-06-29 DIAGNOSIS — G2581 Restless legs syndrome: Secondary | ICD-10-CM | POA: Diagnosis present

## 2022-06-29 DIAGNOSIS — G4733 Obstructive sleep apnea (adult) (pediatric): Secondary | ICD-10-CM | POA: Diagnosis present

## 2022-06-29 DIAGNOSIS — F17200 Nicotine dependence, unspecified, uncomplicated: Secondary | ICD-10-CM | POA: Diagnosis present

## 2022-06-29 DIAGNOSIS — J9601 Acute respiratory failure with hypoxia: Secondary | ICD-10-CM | POA: Diagnosis present

## 2022-06-29 DIAGNOSIS — Z6841 Body Mass Index (BMI) 40.0 and over, adult: Secondary | ICD-10-CM

## 2022-06-29 DIAGNOSIS — Z801 Family history of malignant neoplasm of trachea, bronchus and lung: Secondary | ICD-10-CM

## 2022-06-29 DIAGNOSIS — Z20822 Contact with and (suspected) exposure to covid-19: Secondary | ICD-10-CM | POA: Diagnosis present

## 2022-06-29 DIAGNOSIS — R778 Other specified abnormalities of plasma proteins: Secondary | ICD-10-CM | POA: Diagnosis present

## 2022-06-29 DIAGNOSIS — Z720 Tobacco use: Secondary | ICD-10-CM | POA: Diagnosis present

## 2022-06-29 DIAGNOSIS — R32 Unspecified urinary incontinence: Secondary | ICD-10-CM | POA: Diagnosis present

## 2022-06-29 DIAGNOSIS — R400 Somnolence: Secondary | ICD-10-CM | POA: Diagnosis present

## 2022-06-29 DIAGNOSIS — F1410 Cocaine abuse, uncomplicated: Secondary | ICD-10-CM | POA: Diagnosis present

## 2022-06-29 DIAGNOSIS — Z5329 Procedure and treatment not carried out because of patient's decision for other reasons: Secondary | ICD-10-CM | POA: Diagnosis not present

## 2022-06-29 DIAGNOSIS — Z818 Family history of other mental and behavioral disorders: Secondary | ICD-10-CM

## 2022-06-29 DIAGNOSIS — Z825 Family history of asthma and other chronic lower respiratory diseases: Secondary | ICD-10-CM

## 2022-06-29 DIAGNOSIS — Z880 Allergy status to penicillin: Secondary | ICD-10-CM

## 2022-06-29 DIAGNOSIS — I10 Essential (primary) hypertension: Secondary | ICD-10-CM | POA: Diagnosis present

## 2022-06-29 DIAGNOSIS — R6510 Systemic inflammatory response syndrome (SIRS) of non-infectious origin without acute organ dysfunction: Secondary | ICD-10-CM | POA: Diagnosis present

## 2022-06-29 DIAGNOSIS — N179 Acute kidney failure, unspecified: Secondary | ICD-10-CM | POA: Diagnosis present

## 2022-06-29 LAB — COMPREHENSIVE METABOLIC PANEL
ALT: 20 U/L (ref 0–44)
AST: 16 U/L (ref 15–41)
Albumin: 3.9 g/dL (ref 3.5–5.0)
Alkaline Phosphatase: 77 U/L (ref 38–126)
Anion gap: 5 (ref 5–15)
BUN: 20 mg/dL (ref 6–20)
CO2: 29 mmol/L (ref 22–32)
Calcium: 8.3 mg/dL — ABNORMAL LOW (ref 8.9–10.3)
Chloride: 103 mmol/L (ref 98–111)
Creatinine, Ser: 1.14 mg/dL — ABNORMAL HIGH (ref 0.44–1.00)
GFR, Estimated: 58 mL/min — ABNORMAL LOW (ref >60)
Glucose, Bld: 183 mg/dL — ABNORMAL HIGH (ref 70–99)
Potassium: 3.6 mmol/L (ref 3.5–5.1)
Sodium: 137 mmol/L (ref 135–145)
Total Bilirubin: 1.2 mg/dL (ref 0.3–1.2)
Total Protein: 7.6 g/dL (ref 6.5–8.1)

## 2022-06-29 LAB — CBC WITH DIFFERENTIAL/PLATELET
Abs Immature Granulocytes: 0.03 10*3/uL (ref 0.00–0.07)
Basophils Absolute: 0 10*3/uL (ref 0.0–0.1)
Basophils Relative: 0 %
Eosinophils Absolute: 0 10*3/uL (ref 0.0–0.5)
Eosinophils Relative: 0 %
HCT: 55.1 % — ABNORMAL HIGH (ref 36.0–46.0)
Hemoglobin: 17.9 g/dL — ABNORMAL HIGH (ref 12.0–15.0)
Immature Granulocytes: 0 %
Lymphocytes Relative: 14 %
Lymphs Abs: 1.4 10*3/uL (ref 0.7–4.0)
MCH: 29.8 pg (ref 26.0–34.0)
MCHC: 32.5 g/dL (ref 30.0–36.0)
MCV: 91.8 fL (ref 80.0–100.0)
Monocytes Absolute: 0.5 10*3/uL (ref 0.1–1.0)
Monocytes Relative: 5 %
Neutro Abs: 8 10*3/uL — ABNORMAL HIGH (ref 1.7–7.7)
Neutrophils Relative %: 81 %
Platelets: 183 10*3/uL (ref 150–400)
RBC: 6 MIL/uL — ABNORMAL HIGH (ref 3.87–5.11)
RDW: 13.2 % (ref 11.5–15.5)
WBC: 10.1 10*3/uL (ref 4.0–10.5)
nRBC: 0 % (ref 0.0–0.2)

## 2022-06-29 LAB — RESP PANEL BY RT-PCR (FLU A&B, COVID) ARPGX2
Influenza A by PCR: NEGATIVE
Influenza B by PCR: NEGATIVE
SARS Coronavirus 2 by RT PCR: NEGATIVE

## 2022-06-29 LAB — TROPONIN I (HIGH SENSITIVITY)
Troponin I (High Sensitivity): 31 ng/L — ABNORMAL HIGH (ref <18)
Troponin I (High Sensitivity): 24 ng/L — ABNORMAL HIGH (ref <18)

## 2022-06-29 LAB — LACTIC ACID, PLASMA: Lactic Acid, Venous: 1 mmol/L (ref 0.5–1.9)

## 2022-06-29 MED ORDER — SODIUM CHLORIDE 0.9 % IV SOLN
500.0000 mg | Freq: Once | INTRAVENOUS | Status: AC
  Administered 2022-06-29: 500 mg via INTRAVENOUS
  Filled 2022-06-29: qty 5

## 2022-06-29 MED ORDER — METHYLPREDNISOLONE SODIUM SUCC 125 MG IJ SOLR
125.0000 mg | INTRAMUSCULAR | Status: AC
  Administered 2022-06-29: 125 mg via INTRAVENOUS
  Filled 2022-06-29: qty 2

## 2022-06-29 MED ORDER — IPRATROPIUM-ALBUTEROL 0.5-2.5 (3) MG/3ML IN SOLN
3.0000 mL | Freq: Once | RESPIRATORY_TRACT | Status: AC
  Administered 2022-06-29: 3 mL via RESPIRATORY_TRACT
  Filled 2022-06-29: qty 3

## 2022-06-29 MED ORDER — MAGNESIUM SULFATE 2 GM/50ML IV SOLN
2.0000 g | INTRAVENOUS | Status: AC
  Administered 2022-06-29: 2 g via INTRAVENOUS

## 2022-06-29 MED ORDER — SODIUM CHLORIDE 0.9 % IV SOLN
2.0000 g | Freq: Once | INTRAVENOUS | Status: AC
  Administered 2022-06-29: 2 g via INTRAVENOUS
  Filled 2022-06-29: qty 20

## 2022-06-29 NOTE — ED Notes (Signed)
Pt states appx 3 weeks ago, she fell off toilet while sleeping, and hit her center of her chest on side of bath tup.  Pt states she had hard time taking deep breath after this fall.

## 2022-06-29 NOTE — ED Notes (Signed)
Unable to get a BP at this time, begins yelling she will leave despite trying multiple cuff locations. Otherwise calm when not attempting BP.

## 2022-06-29 NOTE — ED Notes (Signed)
Pt declines to keep [place] in nose and is mouth breathing, causing O2 to drop repeatedly to 80s. [place] removed and NRB at 10L placed.

## 2022-06-29 NOTE — ED Triage Notes (Signed)
FIRST NURSE NOTE:  Pt  here with SOB x 3 days, pt arrived via [facility] from hotel room, pt had temp 99.2, ronchi, wheezing present per EMS. Productive cough green sputum.  No PMH of COPD or CHF  EKG= ST p 110  Hx HTn 178/122  RA 85% initially, 98% 2L [place]  Duoneb+ Albuterol given with EMS  20G L AC  CBG 171

## 2022-06-29 NOTE — ED Triage Notes (Signed)
Pt presents to ER via ems from home with c/o sob for a few days but has become worse today.  Pt states she has had a productive cough at home with green phlegm production.  Pt sounds very congested in triage.  Pt initially hypoxic with ems with O2 sats improving on 2L via [place].  Pt A&O x4 at this time in triage.    Pt's o2 sats at 89% on 2L in triage.

## 2022-06-29 NOTE — ED Provider Notes (Signed)
[facility] Provider Note    Event Date/Time   First MD Initiated Contact with Patient 06/29/22 2312     (approximate)   History   Chief Complaint: Shortness of Breath   HPI  Latoya Brown is a 51 y.o. female with no significant past medical history who comes ED complaining of shortness of breath and productive cough for the past few days.  She also reports chills.  Denies any history of lung disease such as COPD.     Physical Exam   Triage Vital Signs: ED Triage Vitals  Enc Vitals Group     BP 06/29/22 1955 (!) 155/120     Pulse Rate 06/29/22 1955 (!) 112     Resp 06/29/22 1955 (!) 26     Temp 06/29/22 1955 98.7 F (37.1 C)     Temp Source 06/29/22 1955 Oral     SpO2 06/29/22 1955 96 %     Weight 06/29/22 1957 269 lb (122 kg)     Height 06/29/22 1957 5' 5.5" (1.664 m)     Head Circumference --      Peak Flow --      Pain Score 06/29/22 1957 0     Pain Loc --      Pain Edu? --      Excl. in GC? --     Most recent vital signs: Vitals:   06/29/22 2200 06/29/22 2230  BP: (!) 151/125 (!) 163/106  Pulse: (!) 110 (!) 103  Resp: (!) 22 (!) 22  Temp:    SpO2: 100% 100%    General: Awake, no distress.  CV:  Good peripheral perfusion.  Tachycardia heart rate 110 Resp:  Normal effort.  Symmetric air movement, adequate air entry in all lung fields.  Diffuse coarse breath sounds with expiratory wheezing and prolonged expiratory phase. Abd:  No distention.  Soft nontender Other:  No calf tenderness, no lower extremity swelling.   ED Results / Procedures / Treatments   Labs (all labs ordered are listed, but only abnormal results are displayed) Labs Reviewed  CBC WITH DIFFERENTIAL/PLATELET - Abnormal; Notable for the following components:      Result Value   RBC 6.00 (*)    Hemoglobin 17.9 (*)    HCT 55.1 (*)    Neutro Abs 8.0 (*)    All other components within normal limits  COMPREHENSIVE METABOLIC PANEL - Abnormal; Notable for the  following components:   Glucose, Bld 183 (*)    Creatinine, Ser 1.14 (*)    Calcium 8.3 (*)    GFR, Estimated 58 (*)    All other components within normal limits  TROPONIN I (HIGH SENSITIVITY) - Abnormal; Notable for the following components:   Troponin I (High Sensitivity) 31 (*)    All other components within normal limits  TROPONIN I (HIGH SENSITIVITY) - Abnormal; Notable for the following components:   Troponin I (High Sensitivity) 24 (*)    All other components within normal limits  CULTURE, BLOOD (ROUTINE X 2)  CULTURE, BLOOD (ROUTINE X 2)  RESP PANEL BY RT-PCR (FLU A&B, COVID) ARPGX2  LACTIC ACID, PLASMA  LACTIC ACID, PLASMA     EKG Interpreted by me Sinus tachycardia rate 114.  Left axis, LVH, normal intervals.  Occasional PJC.   RADIOLOGY Chest x-ray interpreted by me, negative for consolidation edema or effusion.  Radiology report reviewed.   PROCEDURES:  Procedures   MEDICATIONS ORDERED IN ED: Medications  magnesium sulfate IVPB 2 g  50 mL (has no administration in time range)  azithromycin (ZITHROMAX) 500 mg in sodium chloride 0.9 % 250 mL IVPB (has no administration in time range)  methylPREDNISolone sodium succinate (SOLU-MEDROL) 125 mg/2 mL injection 125 mg (125 mg Intravenous Given 06/29/22 2222)  ipratropium-albuterol (DUONEB) 0.5-2.5 (3) MG/3ML nebulizer solution 3 mL (3 mLs Nebulization Given 06/29/22 2215)  cefTRIAXone (ROCEPHIN) 2 g in sodium chloride 0.9 % 100 mL IVPB (2 g Intravenous New Bag/Given 06/29/22 2234)     IMPRESSION / MDM / ASSESSMENT AND PLAN / ED COURSE  I reviewed the triage vital signs and the nursing notes.                              Differential diagnosis includes, but is not limited to, pneumonia, COPD exacerbation, pleural effusion, pulmonary edema, pneumothorax.  Doubt CHF, NSTEMI, PE, pericardial effusion  Patient's presentation is most consistent with acute presentation with potential threat to life or bodily  function.  Patient presents with symptoms of pneumonia and COPD with acute hypoxic respiratory failure.  Not in distress.  Patient given bronchodilators and Solu-Medrol with improvement in tachycardia and tachypnea.  Oxygenation is normalized on 2 L nasal cannula.  We will need to admit for further management.       FINAL CLINICAL IMPRESSION(S) / ED DIAGNOSES   Final diagnoses:  Acute respiratory failure with hypoxia (HCC)     Rx / DC Orders   ED Discharge Orders     None        Note:  This document was prepared using Dragon voice recognition software and may include unintentional dictation errors.   Sharman Cheek, MD 06/29/22 2322

## 2022-06-29 NOTE — H&P (Signed)
History and Physical   [facility] - Echo @ [facility] Admission History and Physical [employer], D.O.    Patient Name: Latoya Brown MR#: [id number] Date of Birth: 1971/09/06 Date of Admission: 06/29/2022  Referring MD/NP/PA: Dr. Scotty Court Primary Care Physician: Sallyanne Kuster, NP  Chief Complaint:  Chief Complaint  Patient presents with   Shortness of Breath    HPI: Latoya Brown is a 51 y.o. female with a known history of HTN, lumbar disc disease, presents to the emergency department for evaluation of SOB.  Patient was in a usual state of health until 2-3 days ago when she developed progressively worsening shortness of breath.  Two months ago she was sleeping on the toilet and fell fracturing two ribs.  Reports that the pain has hindered her breathing.  Smokes one pack per day for many years and smokes crack daily for 20 years.    Patient denies fevers, weakness, dizziness, chest pain, N/V/C/D, abdominal pain, dysuria/frequency, changes in mental status.    Otherwise there has been no change in status. Patient has been taking medication as prescribed and there has been no recent change in medication or diet.  No recent antibiotics.  There has been no recent illness, hospitalizations, travel or sick contacts.    EMS/ED Course: Patient received Rocephin, azithromycin, Solu-Medrol, magnesium, DuoNeb. Medical admission has been requested for further management of COPD exacerbation.  Review of Systems:  CONSTITUTIONAL: No fever/chills, fatigue, weakness, weight gain/loss, headache. EYES: No blurry or double vision. ENT: No tinnitus, postnasal drip, redness or soreness of the oropharynx. RESPIRATORY: Positive  cough, dyspnea, wheeze.  No hemoptysis.  CARDIOVASCULAR: No chest pain, palpitations, syncope, orthopnea. No lower extremity edema.  GASTROINTESTINAL: No nausea, vomiting, abdominal pain, diarrhea, constipation.  No hematemesis, melena or  hematochezia. GENITOURINARY: No dysuria, frequency, hematuria. ENDOCRINE: No polyuria or nocturia. No heat or cold intolerance. HEMATOLOGY: No anemia, bruising, bleeding. INTEGUMENTARY: No rashes, ulcers, lesions. MUSCULOSKELETAL: No arthritis, gout NEUROLOGIC: No numbness, tingling, ataxia, seizure-type activity, weakness. PSYCHIATRIC: No anxiety, depression, insomnia.   Past Medical History:  Diagnosis Date   DDD (degenerative disc disease), lumbar    Hypertension    Sciatica     Past Surgical History:  Procedure Laterality Date   TONSILLECTOMY     TUBAL LIGATION       reports that she has been smoking cigarettes and e-cigarettes. She has been smoking an average of 1 pack per day. She has never used smokeless tobacco. She reports current drug use. Drugs: Marijuana and Cocaine. She reports that she does not drink alcohol.  Allergies  Allergen Reactions   Aspirin Anaphylaxis   Penicillins Hives    Family History  Problem Relation Age of Onset   Depression Mother    COPD Mother    Bipolar disorder Mother    Liver disease Mother    Heart disease Father    Vascular Disease Father    Bipolar disorder Sister    Depression Sister    Schizophrenia Sister    Lung cancer Maternal Grandmother     Prior to Admission medications   Medication Sig Start Date End Date Taking? Authorizing Provider  amLODipine (NORVASC) 5 MG tablet Take 1 tablet (5 mg total) by mouth daily. 02/16/22  Yes Abernathy, Arlyss Repress, NP  metoprolol tartrate (LOPRESSOR) 25 MG tablet Take 1 tablet (25 mg total) by mouth 2 (two) times daily. 02/16/22  Yes Abernathy, Alyssa, NP  rOPINIRole (REQUIP) 1 MG tablet Take 1 tablet (1 mg total) by mouth at bedtime.  02/16/22  Yes Abernathy, Arlyss Repress, NP  triamcinolone ointment (KENALOG) 0.5 % Apply 1 application. topically 2 (two) times daily. To affected area until rash resolves. 02/16/22  Yes Sallyanne Kuster, NP    Physical Exam: Vitals:   06/29/22 2130 06/29/22 2145  06/29/22 2200 06/29/22 2230  BP: (!) 179/120  (!) 151/125 (!) 163/106  Pulse: (!) 107 (!) 107 (!) 110 (!) 103  Resp: (!) 22 (!) 21 (!) 22 (!) 22  Temp:      TempSrc:      SpO2: 100% 100% 100% 100%  Weight:      Height:        GENERAL: 51 y.o.-year-old white female patient, chronically ill appearing lying in the bed in no acute distress.  Pleasant and cooperative.   HEENT: Head atraumatic, normocephalic. Pupils equal. Mucus membranes moist. NECK: Supple. No JVD. CHEST: Diminshed air exchange, prolonged expiration, wheezing throughout.  No reproducible chest wall tenderness.  CARDIOVASCULAR: S1, S2 normal. No murmurs, rubs, or gallops. Cap refill <2 seconds. Pulses intact distally.  ABDOMEN: Soft, nondistended, nontender. No rebound, guarding, rigidity. Normoactive bowel sounds present in all four quadrants.  EXTREMITIES: No pedal edema, cyanosis, or clubbing. No calf tenderness or Homan's sign.  NEUROLOGIC: The patient is alert and oriented x 3. Cranial nerves II through XII are grossly intact with no focal sensorimotor deficit. PSYCHIATRIC:  Normal affect, mood, thought content. SKIN: Warm, dry, and intact without obvious rash, lesion, or ulcer.    Labs on Admission:  CBC: Recent Labs  Lab 06/29/22 2006  WBC 10.1  NEUTROABS 8.0*  HGB 17.9*  HCT 55.1*  MCV 91.8  PLT 183   Basic Metabolic Panel: Recent Labs  Lab 06/29/22 2006  NA 137  K 3.6  CL 103  CO2 29  GLUCOSE 183*  BUN 20  CREATININE 1.14*  CALCIUM 8.3*   GFR: Estimated Creatinine Clearance: 75.4 mL/min (A) (by C-G formula based on SCr of 1.14 mg/dL (H)). Liver Function Tests: Recent Labs  Lab 06/29/22 2006  AST 16  ALT 20  ALKPHOS 77  BILITOT 1.2  PROT 7.6  ALBUMIN 3.9   No results for input(s): "LIPASE", "AMYLASE" in the last 168 hours. No results for input(s): "AMMONIA" in the last 168 hours. Coagulation Profile: No results for input(s): "INR", "PROTIME" in the last 168 hours. Cardiac  Enzymes: No results for input(s): "CKTOTAL", "CKMB", "CKMBINDEX", "TROPONINI" in the last 168 hours. BNP (last 3 results) No results for input(s): "PROBNP" in the last 8760 hours. HbA1C: No results for input(s): "HGBA1C" in the last 72 hours. CBG: No results for input(s): "GLUCAP" in the last 168 hours. Lipid Profile: No results for input(s): "CHOL", "HDL", "LDLCALC", "TRIG", "CHOLHDL", "LDLDIRECT" in the last 72 hours. Thyroid Function Tests: No results for input(s): "TSH", "T4TOTAL", "FREET4", "T3FREE", "THYROIDAB" in the last 72 hours. Anemia Panel: No results for input(s): "VITAMINB12", "FOLATE", "FERRITIN", "TIBC", "IRON", "RETICCTPCT" in the last 72 hours. Urine analysis:    Component Value Date/Time   COLORURINE Amber 11/02/2013 1139   APPEARANCEUR Turbid 11/02/2013 1139   LABSPEC 1.011 11/02/2013 1139   PHURINE 6.0 11/02/2013 1139   GLUCOSEU Negative 11/02/2013 1139   HGBUR 2+ 11/02/2013 1139   BILIRUBINUR Negative 11/02/2013 1139   KETONESUR Negative 11/02/2013 1139   PROTEINUR 100 mg/dL 36/64/4034 7425   NITRITE Positive 11/02/2013 1139   LEUKOCYTESUR 2+ 11/02/2013 1139   Sepsis Labs: @LABRCNTIP (procalcitonin:4,lacticidven:4) )No results found for this or any previous visit (from the past 240 hour(s)).   Radiological Exams  on Admission: DG Chest 1 View  Result Date: 06/29/2022 CLINICAL DATA:  [id number] EXAM: CHEST  1 VIEW COMPARISON:  Chest x-ray 05/30/2018 FINDINGS: Slightly more prominent cardiomediastinal silhouette likely due to AP portable technique. The heart and mediastinal contours are within normal limits. No focal consolidation. No pulmonary edema. No pleural effusion. No pneumothorax. No acute osseous abnormality. IMPRESSION: No active disease. Electronically Signed   By: Tish Frederickson M.D.   On: 06/29/2022 21:07    Assessment/Plan  This is a 51 y.o. female with a history of HTN, lumbar disc disease, now being admitted with:  #. Acute respiratory failure  2/2 COPD, new onset - Admit inpatient - IV steroids and azithromycin - Check sputum culture, DDimer - Nebulizers, O2 therapy and expectorants as needed.  - Continuous pulse oximetry - Consider pulmonary consult if not improving.  Will need PFTs after discharge.   #. Acute kidney injury  - IV fluids and repeat BMP in AM.  - Avoid nephrotoxic medications   #. Borderline elevated troponin - Monitor on tele - Trend trops  #. History of hypertension - Continue amlodipine, metoprolol  #. History of lumbar disc disease - Continue Requip  Admission status: Inpatient IV Fluids: Hep-Lock Diet/Nutrition: Heart healthy Consults called: None DVT Px: Lovenox, SCDs and early ambulation. Code Status: Full Code  Disposition Plan: To home in 1-2 days  All the records are reviewed and case discussed with ED provider. Management plans discussed with the patient and/or family who express understanding and agree with plan of care.  Cliffton Spradley D.O. on 06/29/2022 at 11:05 PM CC: Primary care physician; Sallyanne Kuster, NP   06/29/2022, 11:05 PM

## 2022-06-30 DIAGNOSIS — R6510 Systemic inflammatory response syndrome (SIRS) of non-infectious origin without acute organ dysfunction: Secondary | ICD-10-CM | POA: Diagnosis present

## 2022-06-30 DIAGNOSIS — I10 Essential (primary) hypertension: Secondary | ICD-10-CM | POA: Diagnosis present

## 2022-06-30 DIAGNOSIS — F1410 Cocaine abuse, uncomplicated: Secondary | ICD-10-CM | POA: Diagnosis present

## 2022-06-30 DIAGNOSIS — J9601 Acute respiratory failure with hypoxia: Principal | ICD-10-CM | POA: Diagnosis present

## 2022-06-30 DIAGNOSIS — R400 Somnolence: Secondary | ICD-10-CM | POA: Diagnosis present

## 2022-06-30 DIAGNOSIS — G2581 Restless legs syndrome: Secondary | ICD-10-CM | POA: Diagnosis present

## 2022-06-30 DIAGNOSIS — J441 Chronic obstructive pulmonary disease with (acute) exacerbation: Secondary | ICD-10-CM

## 2022-06-30 DIAGNOSIS — Z720 Tobacco use: Secondary | ICD-10-CM | POA: Diagnosis present

## 2022-06-30 LAB — CBC
HCT: 54.6 % — ABNORMAL HIGH (ref 36.0–46.0)
Hemoglobin: 17.9 g/dL — ABNORMAL HIGH (ref 12.0–15.0)
MCH: 29.5 pg (ref 26.0–34.0)
MCHC: 32.8 g/dL (ref 30.0–36.0)
MCV: 90.1 fL (ref 80.0–100.0)
Platelets: 171 10*3/uL (ref 150–400)
RBC: 6.06 MIL/uL — ABNORMAL HIGH (ref 3.87–5.11)
RDW: 13.2 % (ref 11.5–15.5)
WBC: 9 10*3/uL (ref 4.0–10.5)
nRBC: 0 % (ref 0.0–0.2)

## 2022-06-30 LAB — COMPREHENSIVE METABOLIC PANEL
ALT: 22 U/L (ref 0–44)
AST: 13 U/L — ABNORMAL LOW (ref 15–41)
Albumin: 4.1 g/dL (ref 3.5–5.0)
Alkaline Phosphatase: 77 U/L (ref 38–126)
Anion gap: 8 (ref 5–15)
BUN: 21 mg/dL — ABNORMAL HIGH (ref 6–20)
CO2: 28 mmol/L (ref 22–32)
Calcium: 8.5 mg/dL — ABNORMAL LOW (ref 8.9–10.3)
Chloride: 104 mmol/L (ref 98–111)
Creatinine, Ser: 1.07 mg/dL — ABNORMAL HIGH (ref 0.44–1.00)
GFR, Estimated: >60 mL/min (ref >60)
Glucose, Bld: 173 mg/dL — ABNORMAL HIGH (ref 70–99)
Potassium: 3.7 mmol/L (ref 3.5–5.1)
Sodium: 140 mmol/L (ref 135–145)
Total Bilirubin: 0.9 mg/dL (ref 0.3–1.2)
Total Protein: 8.1 g/dL (ref 6.5–8.1)

## 2022-06-30 LAB — TROPONIN I (HIGH SENSITIVITY)
Troponin I (High Sensitivity): 29 ng/L — ABNORMAL HIGH (ref <18)
Troponin I (High Sensitivity): 19 ng/L — ABNORMAL HIGH (ref <18)

## 2022-06-30 LAB — TSH: TSH: 0.544 u[IU]/mL (ref 0.350–4.500)

## 2022-06-30 LAB — LACTIC ACID, PLASMA: Lactic Acid, Venous: 1.2 mmol/L (ref 0.5–1.9)

## 2022-06-30 LAB — HIV ANTIBODY (ROUTINE TESTING W REFLEX): HIV Screen 4th Generation wRfx: NONREACTIVE

## 2022-06-30 MED ORDER — IPRATROPIUM-ALBUTEROL 0.5-2.5 (3) MG/3ML IN SOLN
3.0000 mL | Freq: Four times a day (QID) | RESPIRATORY_TRACT | Status: DC
  Administered 2022-06-30 – 2022-07-01 (×7): 3 mL via RESPIRATORY_TRACT
  Filled 2022-06-30 (×7): qty 3

## 2022-06-30 MED ORDER — SODIUM CHLORIDE 0.9% FLUSH
3.0000 mL | Freq: Two times a day (BID) | INTRAVENOUS | Status: DC
  Administered 2022-06-30 – 2022-07-01 (×5): 3 mL via INTRAVENOUS

## 2022-06-30 MED ORDER — ALBUTEROL SULFATE (2.5 MG/3ML) 0.083% IN NEBU: 2.5000 mg | INHALATION_SOLUTION | RESPIRATORY_TRACT | Status: DC | PRN

## 2022-06-30 MED ORDER — HYDRALAZINE HCL 20 MG/ML IJ SOLN
10.0000 mg | Freq: Four times a day (QID) | INTRAMUSCULAR | Status: DC | PRN
  Administered 2022-06-30: 10 mg via INTRAVENOUS
  Filled 2022-06-30: qty 1

## 2022-06-30 MED ORDER — NICOTINE 14 MG/24HR TD PT24
14.0000 mg | MEDICATED_PATCH | Freq: Every day | TRANSDERMAL | Status: DC
  Administered 2022-06-30 – 2022-07-01 (×2): 14 mg via TRANSDERMAL
  Filled 2022-06-30 (×2): qty 1

## 2022-06-30 MED ORDER — DM-GUAIFENESIN ER 30-600 MG PO TB12
1.0000 | ORAL_TABLET | Freq: Two times a day (BID) | ORAL | Status: DC
  Administered 2022-06-30 – 2022-07-01 (×3): 1 via ORAL
  Filled 2022-06-30 (×3): qty 1

## 2022-06-30 MED ORDER — HYDRALAZINE HCL 20 MG/ML IJ SOLN
10.0000 mg | Freq: Four times a day (QID) | INTRAMUSCULAR | Status: DC | PRN
  Administered 2022-07-01 (×2): 10 mg via INTRAVENOUS
  Filled 2022-06-30 (×2): qty 1

## 2022-06-30 MED ORDER — PREDNISONE 20 MG PO TABS: 40.0000 mg | ORAL_TABLET | Freq: Every day | ORAL | Status: DC

## 2022-06-30 MED ORDER — ACETAMINOPHEN 325 MG PO TABS: 650.0000 mg | ORAL_TABLET | Freq: Four times a day (QID) | ORAL | Status: DC | PRN

## 2022-06-30 MED ORDER — TRAZODONE HCL 50 MG PO TABS: 25.0000 mg | ORAL_TABLET | Freq: Every evening | ORAL | Status: DC | PRN

## 2022-06-30 MED ORDER — ACETAMINOPHEN 650 MG RE SUPP: 650.0000 mg | Freq: Four times a day (QID) | RECTAL | Status: DC | PRN

## 2022-06-30 MED ORDER — AZITHROMYCIN 500 MG PO TABS
500.0000 mg | ORAL_TABLET | Freq: Every day | ORAL | Status: DC
  Administered 2022-07-01: 500 mg via ORAL
  Filled 2022-06-30: qty 1

## 2022-06-30 MED ORDER — FLUTICASONE FUROATE-VILANTEROL 100-25 MCG/ACT IN AEPB
1.0000 | INHALATION_SPRAY | Freq: Every day | RESPIRATORY_TRACT | Status: DC
  Administered 2022-06-30 – 2022-07-01 (×2): 1 via RESPIRATORY_TRACT
  Filled 2022-06-30: qty 28

## 2022-06-30 MED ORDER — METOPROLOL TARTRATE 25 MG PO TABS
25.0000 mg | ORAL_TABLET | Freq: Two times a day (BID) | ORAL | Status: DC
  Administered 2022-06-30 – 2022-07-01 (×5): 25 mg via ORAL
  Filled 2022-06-30 (×5): qty 1

## 2022-06-30 MED ORDER — METHYLPREDNISOLONE SODIUM SUCC 125 MG IJ SOLR
60.0000 mg | Freq: Four times a day (QID) | INTRAMUSCULAR | Status: AC
  Administered 2022-06-30 (×4): 60 mg via INTRAVENOUS
  Filled 2022-06-30 (×4): qty 2

## 2022-06-30 MED ORDER — BISACODYL 5 MG PO TBEC: 5.0000 mg | DELAYED_RELEASE_TABLET | Freq: Every day | ORAL | Status: DC | PRN

## 2022-06-30 MED ORDER — MORPHINE SULFATE (PF) 2 MG/ML IV SOLN: 1.0000 mg | Freq: Four times a day (QID) | INTRAVENOUS | Status: DC | PRN

## 2022-06-30 MED ORDER — ONDANSETRON HCL 4 MG/2ML IJ SOLN: 4.0000 mg | Freq: Four times a day (QID) | INTRAMUSCULAR | Status: DC | PRN

## 2022-06-30 MED ORDER — SODIUM CHLORIDE 0.9 % IV SOLN
500.0000 mg | INTRAVENOUS | Status: AC
  Administered 2022-06-30: 500 mg via INTRAVENOUS
  Filled 2022-06-30: qty 5

## 2022-06-30 MED ORDER — AMLODIPINE BESYLATE 5 MG PO TABS
5.0000 mg | ORAL_TABLET | Freq: Every day | ORAL | Status: DC
  Administered 2022-06-30: 5 mg via ORAL
  Filled 2022-06-30: qty 1

## 2022-06-30 MED ORDER — HYDROCODONE-ACETAMINOPHEN 5-325 MG PO TABS
1.0000 | ORAL_TABLET | ORAL | Status: DC | PRN
  Administered 2022-06-30 (×2): 1 via ORAL
  Filled 2022-06-30 (×2): qty 1

## 2022-06-30 MED ORDER — STERILE WATER FOR INJECTION IJ SOLN
INTRAMUSCULAR | Status: AC
  Administered 2022-06-30: 10 mL
  Filled 2022-06-30: qty 10

## 2022-06-30 MED ORDER — ROPINIROLE HCL 1 MG PO TABS
1.0000 mg | ORAL_TABLET | Freq: Every day | ORAL | Status: DC
  Administered 2022-06-30 – 2022-07-01 (×2): 1 mg via ORAL
  Filled 2022-06-30 (×2): qty 1

## 2022-06-30 MED ORDER — SENNOSIDES-DOCUSATE SODIUM 8.6-50 MG PO TABS
1.0000 | ORAL_TABLET | Freq: Every evening | ORAL | Status: DC | PRN
Start: 2022-06-30 — End: 2022-07-02

## 2022-06-30 MED ORDER — ONDANSETRON HCL 4 MG PO TABS: 4.0000 mg | ORAL_TABLET | Freq: Four times a day (QID) | ORAL | Status: DC | PRN

## 2022-06-30 NOTE — Assessment & Plan Note (Addendum)
Patient counseled on the importance of cessation and to strictly avoid smoking any substance or tobacco given her respiratory illness and likely chronic lung disease. -- Monitor for withdrawal symptoms

## 2022-06-30 NOTE — Assessment & Plan Note (Signed)
Most likely sleep apnea.  Patient was referred for sleep study as outpatient but unable to get this done due to loss of health insurance. --Nocturnal pulse ox study tonight -- Outpatient sleep study as soon as possible

## 2022-06-30 NOTE — Assessment & Plan Note (Signed)
-  Continue home Requip 

## 2022-06-30 NOTE — Assessment & Plan Note (Signed)
Body mass index is 44.1 kg/m. Complicates overall care and prognosis.  Recommend lifestyle modifications including physical activity and diet for weight loss and overall long-term health. Contributes to sleep apnea and respiratory mechanics, increasing risk of respiratory complications.

## 2022-06-30 NOTE — Assessment & Plan Note (Signed)
Patient presented with tachycardia and tachypnea in the setting of COPD exacerbation.  No evidence of infection to suggest sepsis at this time.

## 2022-06-30 NOTE — Assessment & Plan Note (Signed)
Patient counseled on the importance of smoking cessation given her lung disease now with exacerbation leading to hospital admission. -- Nicotine patch ordered

## 2022-06-30 NOTE — Progress Notes (Signed)
Pt refused SCD's. Will continue to encourage ambulation.

## 2022-06-30 NOTE — Progress Notes (Addendum)
Progress Note   Patient: Latoya Brown QPY:[id number] DOB: 15-Jan-1971 DOA: 06/29/2022     1 DOS: the patient was seen and examined on 06/30/2022   Brief hospital course: Latoya Brown is a 51 y.o. female with a known history of HTN, lumbar disc disease, likely OSA (get sleep study due to loss of health insurance), presents to the emergency department for evaluation of SOB and productive cough.  Patient was in a usual state of health until 2-3 days ago when she developed progressively worsening shortness of breath.  Two months ago she was sleeping on the toilet and fell fracturing two ribs.  Reports that the pain has hindered her breathing.  Per H&P, on admission she endorsed she smokes one pack per day for many years and smokes crack daily for 20 years.  In the ED, patient was tachycardic and tachypneic, BP 151/125.  Unclear what initial oxygen saturation was but she was placed on 4 L/min O2 with SPO2 96%.  She was treated empirically for COPD exacerbation with nebulizers and IV steroids and responded well.  She was therefore admitted to the hospital for further evaluation management of COPD with acute exacerbation, apparently new diagnosis.  Patient reports profound daytime sleepiness, states which she wonders if she is narcoleptic.  She falls asleep as soon as she sits down including on the toilet.  She was referred for sleep study but never able to get it done due to loss of health insurance.  Assessment and Plan: * COPD with acute exacerbation (HCC) Presented with progressively worsening shortness of breath, productive cough and had diffuse wheezing on exam in the ED. 7/28: Continues to have diffuse expiratory wheezing and remains on supplemental oxygen. -- Continue IV steroids -- Possibly transition to oral steroid tomorrow if wheezing is improved --Zithromax -- Scheduled DuoNebs -- As needed albuterol nebs -- Breo Ellipta daily --Pulmonary hygiene with I-S and flutter -- Scheduled  Mucinex -- Recommend outpatient PFTs for formal evaluation  Acute respiratory failure with hypoxia [facility]) Patient presented with progressively worsening shortness of breath and started on supplemental oxygen in the ED at 4 L/min, briefly on 10 L/min and today remains on 3 L/min.  Charted O2 sats on room air not available but in setting of COPD exacerbation, hypoxic respiratory failure clinically relevant. -- Supplemental O2 to maintain sats 88 to 94%, wean as tolerated -- Manage COPD as outlined  Daytime somnolence Most likely sleep apnea.  Patient was referred for sleep study as outpatient but unable to get this done due to loss of health insurance. -- Outpatient sleep study as soon as possible  Essential hypertension BP uncontrolled on admission. -- Continue home amlodipine and metoprolol -- Adjust antihypertensives as needed t -- Close PCP follow-up  SIRS (systemic inflammatory response syndrome) (HCC) Patient presented with tachycardia and tachypnea in the setting of COPD exacerbation.  No evidence of infection to suggest sepsis at this time.   Obesity, Class III, BMI 40-49.9 (morbid obesity) (HCC) Body mass index is 44.1 kg/m. Complicates overall care and prognosis.  Recommend lifestyle modifications including physical activity and diet for weight loss and overall long-term health. Contributes to sleep apnea and respiratory mechanics, increasing risk of respiratory complications.  Tobacco abuse Patient counseled on the importance of smoking cessation given her lung disease now with exacerbation leading to hospital admission. -- Nicotine patch ordered  Cocaine abuse (HCC) Patient counseled on the importance of cessation and to strictly avoid smoking any substance or tobacco given her respiratory illness and likely  chronic lung disease. -- Monitor for withdrawal symptoms  Restless leg syndrome Continue home Requip        Subjective: Patient awake sitting up in bed when  seen on rounds today.  She reports breathing has improved but she continues to have productive cough and wheezing.  She is receiving a nebulizer treatment during my encounter.  Denies fevers chills.  Reports extreme daytime sleepiness and immediately falls asleep as soon as she sits down including on the toilet.  No other acute complaints at this time  Physical Exam: Vitals:   06/30/22 1105 06/30/22 1202 06/30/22 1423 06/30/22 1645  BP:  (!) 158/91  (!) 178/99  Pulse:  84  88  Resp:      Temp:  97.9 F (36.6 C)  (!) 97.4 F (36.3 C)  TempSrc:      SpO2: 94% 96% 95% 99%  Weight:      Height:       General exam: awake, alert, no acute distress, obese, very talkative HEENT: moist mucus membranes, hearing grossly normal  Respiratory system: Diffuse expiratory wheezes and poor air movement, increased respiratory effort at rest on 3 L/min nasal cannula O2. Cardiovascular system: normal S1/S2, RRR, no JVD, murmurs, rubs, gallops,  no pedal edema.   Central nervous system: A&O x3. no gross focal neurologic deficits, normal speech Extremities: moves all, no edema, normal tone Skin: dry, intact, normal temperature Psychiatry: normal mood, congruent affect no pressured speech   Data Reviewed:  Notable labs: Glucose 173, BUN 21, creatinine 1.07, calcium 8.5, ALT 13, troponin 29 down trended to 19, lactic acid normal 1.2, hemoglobin elevated 17.9, TSH normal  Family Communication: None  Disposition: Status is: Inpatient Remains inpatient appropriate because: Severity of illness with ongoing COPD exacerbation remaining on IV steroids and supplemental oxygen.   Planned Discharge Destination: Home    Time spent: 45 minutes  Author: Pennie Banter, DO 06/30/2022 4:48 PM  For on call review [email or web address].

## 2022-06-30 NOTE — Hospital Course (Signed)
HPI on admission: "Latoya Brown is a 51 y.o. female with a known history of HTN, lumbar disc disease, likely OSA (get sleep study due to loss of health insurance), presents to the emergency department for evaluation of SOB and productive cough.  Patient was in a usual state of health until 2-3 days ago when she developed progressively worsening shortness of breath.  Two months ago she was sleeping on the toilet and fell fracturing two ribs.  Reports that the pain has hindered her breathing.  She endorsed she smokes one pack per day for many years and smokes crack daily for 20 years."  In the ED, patient was tachycardic and tachypneic, BP 151/125.  Unclear what initial oxygen saturation was but she was placed on 4 L/min O2 with SPO2 96%.  She was treated empirically for COPD exacerbation with nebulizers and IV steroids and responded well.  She was therefore admitted to the hospital for further evaluation management of COPD with acute exacerbation, apparently new diagnosis.  Patient reports profound daytime sleepiness, states which she wonders if she is narcoleptic.  She falls asleep as soon as she sits down including on the toilet.  She was referred for sleep study but never able to get it done due to loss of health insurance.

## 2022-06-30 NOTE — Assessment & Plan Note (Signed)
BP uncontrolled on admission. -- Continue home amlodipine and metoprolol -- Adjust antihypertensives as needed t -- Close PCP follow-up

## 2022-06-30 NOTE — Assessment & Plan Note (Signed)
Patient presented with progressively worsening shortness of breath and started on supplemental oxygen in the ED at 4 L/min, briefly on 10 L/min and today remains on 3 L/min.  Charted O2 sats on room air not available but in setting of COPD exacerbation, hypoxic respiratory failure clinically relevant. -- Supplemental O2 to maintain sats 88 to 94%, wean as tolerated -- Manage COPD as outlined -- Nocturnal pulse oximetry, may qualify for nocturnal home O2

## 2022-06-30 NOTE — Assessment & Plan Note (Signed)
Presented with progressively worsening shortness of breath, productive cough and had diffuse wheezing on exam in the ED. 7/28: Continues to have diffuse expiratory wheezing and remains on supplemental oxygen. -- Continue IV steroids, reduce Solu-Medrol to 60 mg Q12H -- Transition to oral steroid in 24-48 hours pending further improvement --Zithromax x 5 d -- Scheduled DuoNebs -- As needed albuterol nebs -- Breo Ellipta daily --Pulmonary hygiene with I-S and flutter -- Scheduled Mucinex -- Recommend outpatient PFTs for formal evaluation

## 2022-07-01 DIAGNOSIS — R32 Unspecified urinary incontinence: Secondary | ICD-10-CM | POA: Diagnosis present

## 2022-07-01 LAB — EXPECTORATED SPUTUM ASSESSMENT W GRAM STAIN, RFLX TO RESP C

## 2022-07-01 MED ORDER — AMLODIPINE BESYLATE 10 MG PO TABS
10.0000 mg | ORAL_TABLET | Freq: Every day | ORAL | Status: DC
  Administered 2022-07-01: 10 mg via ORAL
  Filled 2022-07-01: qty 1

## 2022-07-01 MED ORDER — NICOTINE POLACRILEX 2 MG MT GUM
2.0000 mg | CHEWING_GUM | OROMUCOSAL | Status: DC | PRN
Start: 2022-07-01 — End: 2022-07-02
  Administered 2022-07-01: 2 mg via ORAL
  Filled 2022-07-01: qty 1

## 2022-07-01 MED ORDER — METHYLPREDNISOLONE SODIUM SUCC 125 MG IJ SOLR
120.0000 mg | Freq: Every day | INTRAMUSCULAR | Status: DC
Start: 2022-07-01 — End: 2022-07-02
  Administered 2022-07-01: 120 mg via INTRAVENOUS
  Filled 2022-07-01: qty 2

## 2022-07-01 MED ORDER — NICOTINE 21 MG/24HR TD PT24: 21.0000 mg | MEDICATED_PATCH | Freq: Every day | TRANSDERMAL | Status: DC

## 2022-07-01 MED ORDER — ALUM & MAG HYDROXIDE-SIMETH 200-200-20 MG/5ML PO SUSP
15.0000 mL | Freq: Four times a day (QID) | ORAL | Status: DC | PRN
  Administered 2022-07-01 (×2): 15 mL via ORAL
  Filled 2022-07-01 (×2): qty 30

## 2022-07-01 MED ORDER — NYSTATIN 100000 UNIT/GM EX CREA
TOPICAL_CREAM | Freq: Two times a day (BID) | CUTANEOUS | Status: DC
  Administered 2022-07-01: 1 via TOPICAL
  Filled 2022-07-01: qty 30

## 2022-07-01 MED ORDER — NICOTINE 21 MG/24HR TD PT24
21.0000 mg | MEDICATED_PATCH | Freq: Every day | TRANSDERMAL | Status: DC
  Administered 2022-07-01: 21 mg via TRANSDERMAL
  Filled 2022-07-01: qty 1

## 2022-07-01 NOTE — Progress Notes (Signed)
NP Jon Billings was at bedside attempting to talk with patient but she was not redirectable. Patient refused to sign AMA form and left. Patient removed IV and removed tele leads herself. Left around 2245.

## 2022-07-01 NOTE — Assessment & Plan Note (Signed)
Patient reports experiencing urinary incontinence upon waking at times, will start voiding before she makes it to the restroom.  She reports as a child, required stretching of her bladder because it was undersized.  Prior to that procedure, she had overnight incontinence frequently and this resolved afterwards. -- Recommend urology follow-up outpatient -- Consider trial of medication for overactive bladder

## 2022-07-01 NOTE — Progress Notes (Signed)
Patient called up to desk to ask when visiting hours were over. Staff told her 2000. CCMD called stating that the patient's oxygen sensor was off. Went into her room and she had 2 oxygen sensors on. Patient stated that visiting hours used to be 2200 or 2300. I informed patient that visiting hours ended at 2000. Checked her oxygen sensor and patient began shouting stating that she cannot wipe her "ass" with the oxygen sensors on. She then hopped up irately out of bed and was unable to be calmed down. Began shouting that she is ready to leave. Attempted to convince patient to stay but she was not redirectable. Charge nurse spoke to patient but she was not able to be calmed down. Will notify provider.

## 2022-07-01 NOTE — Progress Notes (Signed)
Cross Cover Patient became irate with staff because she had to be put on suppplemental oxygen and continuous sat monitoring due to sats reading 83 and nurse trying to check validity of reading. She wants to leave hospital. Tried to explain risks of hypoxia and death. She refused to listen and refused to sign AMA form

## 2022-07-01 NOTE — Progress Notes (Addendum)
Progress Note   Patient: Latoya Brown OBS:[id number] DOB: [birth year] DOA: 06/29/2022     2 DOS: the patient was seen and examined on 07/01/2022   Brief hospital course: HPI on admission: "Latoya Brown is a 51 y.o. female with a known history of HTN, lumbar disc disease, likely OSA (get sleep study due to loss of health insurance), presents to the emergency department for evaluation of SOB and productive cough.  Patient was in a usual state of health until 2-3 days ago when she developed progressively worsening shortness of breath.  Two months ago she was sleeping on the toilet and fell fracturing two ribs.  Reports that the pain has hindered her breathing.  She endorsed she smokes one pack per day for many years and smokes crack daily for 20 years."  In the ED, patient was tachycardic and tachypneic, BP 151/125.  Unclear what initial oxygen saturation was but she was placed on 4 L/min O2 with SPO2 96%.  She was treated empirically for COPD exacerbation with nebulizers and IV steroids and responded well.  She was therefore admitted to the hospital for further evaluation management of COPD with acute exacerbation, apparently new diagnosis.  Patient reports profound daytime sleepiness, states which she wonders if she is narcoleptic.  She falls asleep as soon as she sits down including on the toilet.  She was referred for sleep study but never able to get it done due to loss of health insurance.  Assessment and Plan: * COPD with acute exacerbation (HCC) Presented with progressively worsening shortness of breath, productive cough and had diffuse wheezing on exam in the ED. 7/28: Continues to have diffuse expiratory wheezing and remains on supplemental oxygen. -- Continue IV steroids, reduce Solu-Medrol to 60 mg Q12H -- Transition to oral steroid in 24-48 hours pending further improvement --Zithromax x 5 d -- Scheduled DuoNebs -- As needed albuterol nebs -- Breo Ellipta daily --Pulmonary hygiene  with I-S and flutter -- Scheduled Mucinex -- Recommend outpatient PFTs for formal evaluation  Acute respiratory failure with hypoxia [facility]) Patient presented with progressively worsening shortness of breath and started on supplemental oxygen in the ED at 4 L/min, briefly on 10 L/min and today remains on 3 L/min.  Charted O2 sats on room air not available but in setting of COPD exacerbation, hypoxic respiratory failure clinically relevant. -- Supplemental O2 to maintain sats 88 to 94%, wean as tolerated -- Manage COPD as outlined -- Nocturnal pulse oximetry, may qualify for nocturnal home O2  Daytime somnolence Most likely sleep apnea.  Patient was referred for sleep study as outpatient but unable to get this done due to loss of health insurance. --Nocturnal pulse ox study tonight -- Outpatient sleep study as soon as possible  Essential hypertension BP uncontrolled on admission. -- Continue home amlodipine and metoprolol -- Adjust antihypertensives as needed t -- Close PCP follow-up  Urinary incontinence in female Patient reports experiencing urinary incontinence upon waking at times, will start voiding before she makes it to the restroom.  She reports as a child, required stretching of her bladder because it was undersized.  Prior to that procedure, she had overnight incontinence frequently and this resolved afterwards. -- Recommend urology follow-up outpatient -- Consider trial of medication for overactive bladder  SIRS (systemic inflammatory response syndrome) (HCC) Patient presented with tachycardia and tachypnea in the setting of COPD exacerbation.  No evidence of infection to suggest sepsis at this time.   Obesity, Class III, BMI 40-49.9 (morbid obesity) (HCC) Body mass  index is 44.1 kg/m. Complicates overall care and prognosis.  Recommend lifestyle modifications including physical activity and diet for weight loss and overall long-term health. Contributes to sleep apnea and  respiratory mechanics, increasing risk of respiratory complications.  Tobacco abuse Patient counseled on the importance of smoking cessation given her lung disease now with exacerbation leading to hospital admission. -- Nicotine patch ordered  Cocaine abuse (HCC) Patient counseled on the importance of cessation and to strictly avoid smoking any substance or tobacco given her respiratory illness and likely chronic lung disease. -- Monitor for withdrawal symptoms  Restless leg syndrome Continue home Requip        Subjective: Patient awake sitting up in bed when seen on rounds today.  She reports still being quite short of breath which is worse with exertion.  Has ongoing cough as well but denies fevers or chills.  Reports that history of urinary incontinence first thing in the morning.  Reports that she required to have her bladder stretched during childhood, asks if there is some medication to help with incontinence.  Physical Exam: Vitals:   07/01/22 0739 07/01/22 1156 07/01/22 1415 07/01/22 1437  BP: (!) 170/100 (!) 180/99 (!) 159/139   Pulse: 77 94 68   Resp: 16 20    Temp: 98 F (36.7 C) 98.2 F (36.8 C)    TempSrc:      SpO2: 99% 98%  92%  Weight:      Height:       General exam: awake, alert, no acute distress, obese HEENT: moist mucus membranes, hearing grossly normal  Respiratory system: Ongoing diffuse expiratory wheezes but improved aeration, mild conversational dyspnea, on 3 L/min nasal cannula O2. Cardiovascular system: normal S1/S2, RRR, no JVD, murmurs, rubs, gallops,  no pedal edema.   Central nervous system: A&O x3. no gross focal neurologic deficits, normal speech Extremities: moves all, no edema, normal tone Skin: dry, intact, normal temperature Psychiatry: normal mood, congruent affect no pressured speech   Data Reviewed: No new labs today. Sputum culture sent --pending, specimen acceptable for culture Respiratory viral panel still pending  Family  Communication: None  Disposition: Status is: Inpatient Remains inpatient appropriate because: Severity of illness with ongoing COPD exacerbation remaining on IV steroids and supplemental oxygen.   Planned Discharge Destination: Home    Time spent: 35 minutes  Author: Pennie Banter, DO 07/01/2022 4:34 PM  For on call review [email or web address].

## 2022-07-04 LAB — CULTURE, RESPIRATORY W GRAM STAIN
Culture: NORMAL
Gram Stain: NONE SEEN

## 2022-07-04 LAB — CULTURE, BLOOD (ROUTINE X 2)
Culture: NO GROWTH
Culture: NO GROWTH

## 2022-07-08 NOTE — Discharge Summary (Signed)
Physician Discharge Summary   Patient: Latoya Brown MRN: [id number] DOB: 04-20-1971  Admit date:     06/29/2022  Discharge date: 07/01/2022  Discharge Physician: Pennie Banter   PCP: Sallyanne Kuster, NP   Recommendations at discharge:    Follow up with PCP Follow up with Pulmonology for sleep study and copd    Patient signed out AGAINST MEDICAL ADVICE overnight last night - see cross coverage note  Discharge Diagnoses: Principal Problem:   COPD with acute exacerbation (HCC) Active Problems:   Acute respiratory failure with hypoxia (HCC)   Daytime somnolence   Essential hypertension   Restless leg syndrome   Cocaine abuse (HCC)   Tobacco abuse   Obesity, Class III, BMI 40-49.9 (morbid obesity) (HCC)   SIRS (systemic inflammatory response syndrome) (HCC)   Urinary incontinence in female  Resolved Problems:   * No resolved hospital problems. [facility] Course: HPI on admission: "Latoya Brown is a 51 y.o. female with a known history of HTN, lumbar disc disease, likely OSA (get sleep study due to loss of health insurance), presents to the emergency department for evaluation of SOB and productive cough.  Patient was in a usual state of health until 2-3 days ago when she developed progressively worsening shortness of breath.  Two months ago she was sleeping on the toilet and fell fracturing two ribs.  Reports that the pain has hindered her breathing.  She endorsed she smokes one pack per day for many years and smokes crack daily for 20 years."  In the ED, patient was tachycardic and tachypneic, BP 151/125.  Unclear what initial oxygen saturation was but she was placed on 4 L/min O2 with SPO2 96%.  She was treated empirically for COPD exacerbation with nebulizers and IV steroids and responded well.  She was therefore admitted to the hospital for further evaluation management of COPD with acute exacerbation, apparently new diagnosis.  Patient reports profound daytime sleepiness,  states which she wonders if she is narcoleptic.  She falls asleep as soon as she sits down including on the toilet.  She was referred for sleep study but never able to get it done due to loss of health insurance.  Assessment and Plan: * COPD with acute exacerbation (HCC) Presented with progressively worsening shortness of breath, productive cough and had diffuse wheezing on exam in the ED. 7/28: Continues to have diffuse expiratory wheezing and remains on supplemental oxygen. -- Continue IV steroids, reduce Solu-Medrol to 60 mg Q12H -- Transition to oral steroid in 24-48 hours pending further improvement --Zithromax x 5 d -- Scheduled DuoNebs -- As needed albuterol nebs -- Breo Ellipta daily --Pulmonary hygiene with I-S and flutter -- Scheduled Mucinex -- Recommend outpatient PFTs for formal evaluation  Acute respiratory failure with hypoxia [facility]) Patient presented with progressively worsening shortness of breath and started on supplemental oxygen in the ED at 4 L/min, briefly on 10 L/min and today remains on 3 L/min.  Charted O2 sats on room air not available but in setting of COPD exacerbation, hypoxic respiratory failure clinically relevant. -- Supplemental O2 to maintain sats 88 to 94%, wean as tolerated -- Manage COPD as outlined -- Nocturnal pulse oximetry, may qualify for nocturnal home O2  Daytime somnolence Most likely sleep apnea.  Patient was referred for sleep study as outpatient but unable to get this done due to loss of health insurance. --Nocturnal pulse ox study tonight -- Outpatient sleep study as soon as possible  Essential hypertension BP uncontrolled on  admission. -- Continue home amlodipine and metoprolol -- Adjust antihypertensives as needed t -- Close PCP follow-up  Urinary incontinence in female Patient reports experiencing urinary incontinence upon waking at times, will start voiding before she makes it to the restroom.  She reports as a child, required  stretching of her bladder because it was undersized.  Prior to that procedure, she had overnight incontinence frequently and this resolved afterwards. -- Recommend urology follow-up outpatient -- Consider trial of medication for overactive bladder  SIRS (systemic inflammatory response syndrome) (HCC) Patient presented with tachycardia and tachypnea in the setting of COPD exacerbation.  No evidence of infection to suggest sepsis at this time.   Obesity, Class III, BMI 40-49.9 (morbid obesity) (HCC) Body mass index is 44.1 kg/m. Complicates overall care and prognosis.  Recommend lifestyle modifications including physical activity and diet for weight loss and overall long-term health. Contributes to sleep apnea and respiratory mechanics, increasing risk of respiratory complications.  Tobacco abuse Patient counseled on the importance of smoking cessation given her lung disease now with exacerbation leading to hospital admission. -- Nicotine patch ordered  Cocaine abuse (HCC) Patient counseled on the importance of cessation and to strictly avoid smoking any substance or tobacco given her respiratory illness and likely chronic lung disease. -- Monitor for withdrawal symptoms  Restless leg syndrome Continue home Requip         Consultants: none Procedures performed: none  Disposition: Home Diet recommendation: heart healthy  DISCHARGE MEDICATION: Allergies as of 07/01/2022       Reactions   Aspirin Anaphylaxis   Penicillins Hives        Medication List     ASK your doctor about these medications    amLODipine 5 MG tablet Commonly known as: NORVASC Take 1 tablet (5 mg total) by mouth daily.   metoprolol tartrate 25 MG tablet Commonly known as: LOPRESSOR Take 1 tablet (25 mg total) by mouth 2 (two) times daily.   rOPINIRole 1 MG tablet Commonly known as: REQUIP Take 1 tablet (1 mg total) by mouth at bedtime.   triamcinolone ointment 0.5 % Commonly known as:  KENALOG Apply 1 application. topically 2 (two) times daily. To affected area until rash resolves.        Discharge Exam: Filed Weights   06/29/22 1957 06/30/22 0439  Weight: 122 kg 120.2 kg   NO PHYSICAL EXAM FOR DISCHARGE  --PATIENT LEFT OVERNIGHT AGAINST MEDICAL ADVICE  Condition at discharge:  UNKNOWN - LEFT AMA OVERNIGHT WHILE HYPOXIC  The results of significant diagnostics from this hospitalization (including imaging, microbiology, ancillary and laboratory) are listed below for reference.   Imaging Studies: DG Chest 1 View  Result Date: 06/29/2022 CLINICAL DATA:  [id number] EXAM: CHEST  1 VIEW COMPARISON:  Chest x-ray 05/30/2018 FINDINGS: Slightly more prominent cardiomediastinal silhouette likely due to AP portable technique. The heart and mediastinal contours are within normal limits. No focal consolidation. No pulmonary edema. No pleural effusion. No pneumothorax. No acute osseous abnormality. IMPRESSION: No active disease. Electronically Signed   By: Tish Frederickson M.D.   On: 06/29/2022 21:07    Microbiology: Results for orders placed or performed during the hospital encounter of 06/29/22  Culture, blood (routine x 2)     Status: None   Collection Time: 06/29/22 10:35 PM   Specimen: BLOOD  Result Value Ref Range Status   Specimen Description BLOOD RIGHT HAND  Final   Special Requests   Final    BOTTLES DRAWN AEROBIC AND ANAEROBIC Blood Culture results  may not be optimal due to an inadequate volume of blood received in culture bottles   Culture   Final    NO GROWTH 5 DAYS Performed at [facility], [address] [place], [place] [address]    Report Status 07/04/2022 FINAL  Final  Culture, blood (routine x 2)     Status: None   Collection Time: 06/29/22 10:35 PM   Specimen: BLOOD  Result Value Ref Range Status   Specimen Description BLOOD RIGHT AC  Final   Special Requests   Final    BOTTLES DRAWN AEROBIC AND ANAEROBIC Blood Culture results may not be  optimal due to an inadequate volume of blood received in culture bottles   Culture   Final    NO GROWTH 5 DAYS Performed at [facility], [address] [place], [place] [address]    Report Status 07/04/2022 FINAL  Final  Resp Panel by RT-PCR (Flu A&B, Covid) Anterior Nasal Swab     Status: None   Collection Time: 06/29/22 10:35 PM   Specimen: Anterior Nasal Swab  Result Value Ref Range Status   SARS Coronavirus 2 by RT PCR NEGATIVE NEGATIVE Final    Comment: (NOTE) SARS-CoV-2 target nucleic acids are NOT DETECTED.  The SARS-CoV-2 RNA is generally detectable in upper respiratory specimens during the acute phase of infection. The lowest concentration of SARS-CoV-2 viral copies this assay can detect is 138 copies/mL. A negative result does not preclude SARS-Cov-2 infection and should not be used as the sole basis for treatment or other patient management decisions. A negative result may occur with  improper specimen collection/handling, submission of specimen other than nasopharyngeal swab, presence of viral mutation(s) within the areas targeted by this assay, and inadequate number of viral copies(<138 copies/mL). A negative result must be combined with clinical observations, patient history, and epidemiological information. The expected result is Negative.  Fact Sheet for Patients:  EntrepreneurPulse.com.au  Fact Sheet for Healthcare Providers:  IncredibleEmployment.be  This test is no t yet approved or cleared by the [place] FDA and  has been authorized for detection and/or diagnosis of SARS-CoV-2 by FDA under an Emergency Use Authorization (EUA). This EUA will remain  in effect (meaning this test can be used) for the duration of the COVID-19 declaration under Section 564(b)(1) of the Act, 21 U.S.C.section 360bbb-3(b)(1), unless the authorization is terminated  or revoked sooner.       Influenza A by PCR NEGATIVE NEGATIVE  Final   Influenza B by PCR NEGATIVE NEGATIVE Final    Comment: (NOTE) The Xpert Xpress SARS-CoV-2/FLU/RSV plus assay is intended as an aid in the diagnosis of influenza from Nasopharyngeal swab specimens and should not be used as a sole basis for treatment. Nasal washings and aspirates are unacceptable for Xpert Xpress SARS-CoV-2/FLU/RSV testing.  Fact Sheet for Patients: EntrepreneurPulse.com.au  Fact Sheet for Healthcare Providers: IncredibleEmployment.be  This test is not yet approved or cleared by the [place] FDA and has been authorized for detection and/or diagnosis of SARS-CoV-2 by FDA under an Emergency Use Authorization (EUA). This EUA will remain in effect (meaning this test can be used) for the duration of the COVID-19 declaration under Section 564(b)(1) of the Act, 21 U.S.C. section 360bbb-3(b)(1), unless the authorization is terminated or revoked.  Performed at [facility], [address] [place], [place] [address]   Expectorated Sputum Assessment w Gram Stain, Rflx to Resp Cult     Status: None   Collection Time: 06/30/22  2:39 PM  Specimen: Sputum  Result Value Ref Range Status   Specimen Description SPUTUM  Final   Special Requests NONE  Final   Sputum evaluation   Final    THIS SPECIMEN IS ACCEPTABLE FOR SPUTUM CULTURE Performed at [facility], [address], [place], [place] [address]    Report Status 07/01/2022 FINAL  Final  Culture, Respiratory w Gram Stain     Status: None   Collection Time: 06/30/22  2:39 PM   Specimen: SPU  Result Value Ref Range Status   Specimen Description   Final    SPUTUM Performed at [facility], [address], [place], [place] [address]    Special Requests   Final    NONE Reflexed from [id number] Performed at [facility], [address] [place], [place] [address]    Gram Stain   Final    NO SQUAMOUS EPITHELIAL CELLS SEEN FEW WBC  SEEN FEW GRAM POSITIVE COCCI    Culture   Final    FEW Normal respiratory flora-no Staph aureus or Pseudomonas seen Performed at [facility], [address] [place], [place] [address]    Report Status 07/04/2022 FINAL  Final    Labs: CBC: No results for input(s): "WBC", "NEUTROABS", "HGB", "HCT", "MCV", "PLT" in the last 168 hours. Basic Metabolic Panel: No results for input(s): "NA", "K", "CL", "CO2", "GLUCOSE", "BUN", "CREATININE", "CALCIUM", "MG", "PHOS" in the last 168 hours. Liver Function Tests: No results for input(s): "AST", "ALT", "ALKPHOS", "BILITOT", "PROT", "ALBUMIN" in the last 168 hours. CBG: No results for input(s): "GLUCAP" in the last 168 hours.  Discharge time spent: less than 30 minutes.  Signed: Ezekiel Slocumb, DO [facility] 07/08/2022

## 2022-08-16 ENCOUNTER — Ambulatory Visit: Payer: Self-pay | Admitting: Dermatology

## 2023-01-04 ENCOUNTER — Ambulatory Visit: Payer: Self-pay | Admitting: Internal Medicine

## 2023-03-05 DEATH — deceased
